# Patient Record
Sex: Female | Born: 1955 | Race: White | Hispanic: No | Smoking: Current every day smoker
Health system: Southern US, Community
[De-identification: ages and names within clinical notes are randomized; demographics above are authoritative.]

## PROBLEM LIST (undated history)

## (undated) DIAGNOSIS — F329 Major depressive disorder, single episode, unspecified: Secondary | ICD-10-CM

## (undated) DIAGNOSIS — J449 Chronic obstructive pulmonary disease, unspecified: Secondary | ICD-10-CM

## (undated) DIAGNOSIS — K219 Gastro-esophageal reflux disease without esophagitis: Secondary | ICD-10-CM

## (undated) DIAGNOSIS — F1910 Other psychoactive substance abuse, uncomplicated: Secondary | ICD-10-CM

## (undated) DIAGNOSIS — F32A Depression, unspecified: Secondary | ICD-10-CM

## (undated) DIAGNOSIS — M1990 Unspecified osteoarthritis, unspecified site: Secondary | ICD-10-CM

## (undated) DIAGNOSIS — Z8719 Personal history of other diseases of the digestive system: Secondary | ICD-10-CM

## (undated) HISTORY — PX: TUBAL LIGATION: SHX77

## (undated) HISTORY — DX: Major depressive disorder, single episode, unspecified: F32.9

## (undated) HISTORY — PX: BUNIONECTOMY: SHX129

## (undated) HISTORY — DX: Depression, unspecified: F32.A

## (undated) HISTORY — DX: Other psychoactive substance abuse, uncomplicated: F19.10

## (undated) HISTORY — PX: KNEE SURGERY: SHX244

## (undated) HISTORY — PX: JOINT REPLACEMENT: SHX530

---

## 1994-09-24 HISTORY — PX: BUNIONECTOMY: SHX129

## 1998-01-11 ENCOUNTER — Ambulatory Visit (HOSPITAL_BASED_OUTPATIENT_CLINIC_OR_DEPARTMENT_OTHER): Admission: RE | Admit: 1998-01-11 | Discharge: 1998-01-11 | Payer: Self-pay | Admitting: Orthopedic Surgery

## 1998-09-24 HISTORY — PX: TUBAL LIGATION: SHX77

## 2000-11-15 ENCOUNTER — Ambulatory Visit (HOSPITAL_COMMUNITY): Admission: RE | Admit: 2000-11-15 | Discharge: 2000-11-15 | Payer: Self-pay | Admitting: Orthopedic Surgery

## 2000-11-15 ENCOUNTER — Encounter: Payer: Self-pay | Admitting: Orthopedic Surgery

## 2000-11-29 ENCOUNTER — Encounter: Payer: Self-pay | Admitting: Orthopedic Surgery

## 2000-11-29 ENCOUNTER — Ambulatory Visit (HOSPITAL_COMMUNITY): Admission: RE | Admit: 2000-11-29 | Discharge: 2000-11-29 | Payer: Self-pay | Admitting: Orthopedic Surgery

## 2000-12-13 ENCOUNTER — Encounter: Payer: Self-pay | Admitting: Orthopedic Surgery

## 2000-12-13 ENCOUNTER — Ambulatory Visit (HOSPITAL_COMMUNITY): Admission: RE | Admit: 2000-12-13 | Discharge: 2000-12-13 | Payer: Self-pay | Admitting: Orthopedic Surgery

## 2001-10-08 ENCOUNTER — Ambulatory Visit (HOSPITAL_COMMUNITY): Admission: RE | Admit: 2001-10-08 | Discharge: 2001-10-08 | Payer: Self-pay | Admitting: Family Medicine

## 2001-10-08 ENCOUNTER — Encounter: Payer: Self-pay | Admitting: Family Medicine

## 2002-01-26 ENCOUNTER — Ambulatory Visit (HOSPITAL_COMMUNITY): Admission: RE | Admit: 2002-01-26 | Discharge: 2002-01-26 | Payer: Self-pay | Admitting: Gastroenterology

## 2002-01-26 ENCOUNTER — Encounter: Payer: Self-pay | Admitting: Gastroenterology

## 2002-06-01 ENCOUNTER — Encounter: Admission: RE | Admit: 2002-06-01 | Discharge: 2002-06-01 | Payer: Self-pay | Admitting: Gastroenterology

## 2002-06-01 ENCOUNTER — Encounter: Payer: Self-pay | Admitting: Gastroenterology

## 2004-11-01 ENCOUNTER — Ambulatory Visit (HOSPITAL_COMMUNITY): Admission: RE | Admit: 2004-11-01 | Discharge: 2004-11-01 | Payer: Self-pay | Admitting: Family Medicine

## 2007-12-02 ENCOUNTER — Other Ambulatory Visit: Admission: RE | Admit: 2007-12-02 | Discharge: 2007-12-02 | Payer: Self-pay | Admitting: Family Medicine

## 2009-03-01 ENCOUNTER — Other Ambulatory Visit: Admission: RE | Admit: 2009-03-01 | Discharge: 2009-03-01 | Payer: Self-pay | Admitting: Family Medicine

## 2009-03-01 ENCOUNTER — Encounter: Admission: RE | Admit: 2009-03-01 | Discharge: 2009-03-01 | Payer: Self-pay | Admitting: Family Medicine

## 2011-01-16 ENCOUNTER — Other Ambulatory Visit: Payer: Self-pay | Admitting: Neurosurgery

## 2011-01-16 DIAGNOSIS — M549 Dorsalgia, unspecified: Secondary | ICD-10-CM

## 2011-01-19 ENCOUNTER — Other Ambulatory Visit: Payer: Self-pay | Admitting: Neurosurgery

## 2011-01-19 ENCOUNTER — Ambulatory Visit
Admission: RE | Admit: 2011-01-19 | Discharge: 2011-01-19 | Disposition: A | Discharge status: Home / Self Care | Payer: BC Managed Care – PPO | Source: Ambulatory Visit | Attending: Neurosurgery | Admitting: Neurosurgery

## 2011-01-19 DIAGNOSIS — M549 Dorsalgia, unspecified: Secondary | ICD-10-CM

## 2011-02-02 ENCOUNTER — Ambulatory Visit
Admission: RE | Admit: 2011-02-02 | Discharge: 2011-02-02 | Disposition: A | Discharge status: Home / Self Care | Payer: BC Managed Care – PPO | Source: Ambulatory Visit | Attending: Neurosurgery | Admitting: Neurosurgery

## 2011-02-02 DIAGNOSIS — M549 Dorsalgia, unspecified: Secondary | ICD-10-CM

## 2011-09-19 ENCOUNTER — Other Ambulatory Visit: Payer: Self-pay | Admitting: Neurosurgery

## 2011-09-19 DIAGNOSIS — M549 Dorsalgia, unspecified: Secondary | ICD-10-CM

## 2011-09-25 HISTORY — PX: PILONIDAL CYST EXCISION: SHX744

## 2011-09-26 ENCOUNTER — Ambulatory Visit
Admission: RE | Admit: 2011-09-26 | Discharge: 2011-09-26 | Disposition: A | Discharge status: Home / Self Care | Payer: 59 | Source: Ambulatory Visit | Attending: Neurosurgery | Admitting: Neurosurgery

## 2011-09-26 DIAGNOSIS — M549 Dorsalgia, unspecified: Secondary | ICD-10-CM

## 2011-09-26 MED ORDER — METHYLPREDNISOLONE ACETATE 40 MG/ML INJ SUSP (RADIOLOG
120.0000 mg | Freq: Once | INTRAMUSCULAR | Status: AC
  Administered 2011-09-26: 120 mg via EPIDURAL

## 2011-09-26 MED ORDER — IOHEXOL 180 MG/ML  SOLN
1.0000 mL | Freq: Once | INTRAMUSCULAR | Status: AC | PRN
  Administered 2011-09-26: 1 mL via EPIDURAL

## 2012-08-05 ENCOUNTER — Other Ambulatory Visit: Payer: Self-pay | Admitting: Family Medicine

## 2012-08-05 ENCOUNTER — Other Ambulatory Visit (HOSPITAL_COMMUNITY)
Admission: RE | Admit: 2012-08-05 | Discharge: 2012-08-05 | Disposition: A | Discharge status: Home / Self Care | Payer: 59 | Source: Ambulatory Visit | Attending: Family Medicine | Admitting: Family Medicine

## 2012-08-05 DIAGNOSIS — Z0000 Encounter for general adult medical examination without abnormal findings: Secondary | ICD-10-CM | POA: Insufficient documentation

## 2012-08-05 DIAGNOSIS — Z113 Encounter for screening for infections with a predominantly sexual mode of transmission: Secondary | ICD-10-CM | POA: Insufficient documentation

## 2012-08-06 ENCOUNTER — Other Ambulatory Visit: Payer: Self-pay | Admitting: Family Medicine

## 2012-08-06 DIAGNOSIS — J3489 Other specified disorders of nose and nasal sinuses: Secondary | ICD-10-CM

## 2012-08-07 ENCOUNTER — Other Ambulatory Visit: Payer: 59

## 2012-08-08 ENCOUNTER — Ambulatory Visit
Admission: RE | Admit: 2012-08-08 | Discharge: 2012-08-08 | Disposition: A | Discharge status: Home / Self Care | Payer: 59 | Source: Ambulatory Visit | Attending: Family Medicine | Admitting: Family Medicine

## 2012-08-08 ENCOUNTER — Other Ambulatory Visit: Payer: 59

## 2012-08-08 DIAGNOSIS — J3489 Other specified disorders of nose and nasal sinuses: Secondary | ICD-10-CM

## 2012-10-06 ENCOUNTER — Ambulatory Visit: Payer: 59 | Admitting: Family Medicine

## 2012-10-06 VITALS — BP 128/76 | HR 83 | Temp 98.7°F | Resp 17 | Ht 65.5 in | Wt 135.0 lb

## 2012-10-06 DIAGNOSIS — Z111 Encounter for screening for respiratory tuberculosis: Secondary | ICD-10-CM

## 2012-10-06 NOTE — Progress Notes (Signed)
[facility] [address], [place] [address] [phone] [phone]  Date:  10/06/2012   Name:  DANNIE WOOLEN   DOB:  06/20/1956   MRN:  [id number]  PCP:  No primary provider on file.    Chief Complaint: Immunizations   History of Present Illness:  Gabrielle Garza is a 57 y.o. very pleasant female patient who presents with the following:  She needs the 2nd part of her 2 step PPD.  She had another test performed at [facility]- it was placed on 09/26/12 and read on 09/29/12.    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   Past Medical History  Diagnosis Date  . Depression   . Substance abuse     Past Surgical History  Procedure Date  . Tubal ligation   . Joint replacement   . Bunionectomy     History  Substance Use Topics  . Smoking status: Current Every Day Smoker -- 0.5 packs/day for 30 years    Types: Cigarettes  . Smokeless tobacco: Not on file  . Alcohol Use: No    History reviewed. No pertinent family history.  Allergies  Allergen Reactions  . Flonase (Fluticasone Propionate) Other (See Comments)    Headache     Medication list has been reviewed and updated.  Current Outpatient Prescriptions on File Prior to Visit  Medication Sig Dispense Refill  . atorvastatin (LIPITOR) 20 MG tablet Take 20 mg by mouth daily.      . sertraline (ZOLOFT) 100 MG tablet Take 100 mg by mouth daily.        Review of Systems:  As per HPI- otherwise negative.   Physical Examination: Filed Vitals:   10/06/12 1555  BP: 128/76  Pulse: 83  Temp: 98.7 F (37.1 C)  Resp: 17   Filed Vitals:   10/06/12 1555  Height: 5' 5.5" (1.664 m)  Weight: 135 lb (61.236 kg)   Body mass index is 22.12 kg/(m^2). Ideal Body Weight: Weight in (lb) to have BMI = 25: 152.2   GEN: WDWN, NAD, Non-toxic, A & O x 3 HEENT: Atraumatic, Normocephalic. Neck supple. No  masses, No LAD. Ears and Nose: No external deformity. CV: RRR, No M/G/R. No JVD. No thrill. No extra heart sounds. PULM: CTA B, no wheezes, crackles, rhonchi. No retractions. No resp. distress. No accessory muscle use. EXTR: No c/c/e NEURO Normal gait.  PSYCH: Normally interactive. Conversant. Not depressed or anxious appearing.  Calm demeanor.    Assessment and Plan: 1. Screening-pulmonary TB  TB Skin Test   Placed 2nd PPD for 2 step PPD- follow- up for read as scheduled  Heather Mckendree, MD

## 2012-10-08 ENCOUNTER — Ambulatory Visit (INDEPENDENT_AMBULATORY_CARE_PROVIDER_SITE_OTHER): Payer: 59 | Admitting: Physician Assistant

## 2012-10-08 DIAGNOSIS — Z111 Encounter for screening for respiratory tuberculosis: Secondary | ICD-10-CM

## 2012-10-08 LAB — TB SKIN TEST: TB Skin Test: NEGATIVE

## 2012-10-08 NOTE — Progress Notes (Signed)
Patient here for PPD reading only. Please reading done by CMA. No provider/patient encounter occurred today.   Eula Listen, PA-C 10/08/2012 8:10 PM

## 2013-03-03 ENCOUNTER — Ambulatory Visit
Admission: RE | Admit: 2013-03-03 | Discharge: 2013-03-03 | Disposition: A | Discharge status: Home / Self Care | Payer: 59 | Source: Ambulatory Visit | Attending: Family Medicine | Admitting: Family Medicine

## 2013-03-03 ENCOUNTER — Other Ambulatory Visit: Payer: Self-pay | Admitting: Family Medicine

## 2015-08-25 ENCOUNTER — Other Ambulatory Visit: Payer: Self-pay | Admitting: Family Medicine

## 2015-08-25 DIAGNOSIS — M5136 Other intervertebral disc degeneration, lumbar region: Secondary | ICD-10-CM

## 2015-08-30 ENCOUNTER — Ambulatory Visit
Admission: RE | Admit: 2015-08-30 | Discharge: 2015-08-30 | Disposition: A | Discharge status: Home / Self Care | Payer: Managed Care, Other (non HMO) | Source: Ambulatory Visit | Attending: Family Medicine | Admitting: Family Medicine

## 2015-08-30 DIAGNOSIS — M5136 Other intervertebral disc degeneration, lumbar region: Secondary | ICD-10-CM

## 2015-09-02 ENCOUNTER — Other Ambulatory Visit: Payer: Self-pay | Admitting: Obstetrics and Gynecology

## 2015-09-02 ENCOUNTER — Other Ambulatory Visit (HOSPITAL_COMMUNITY)
Admission: RE | Admit: 2015-09-02 | Discharge: 2015-09-02 | Disposition: A | Discharge status: Home / Self Care | Payer: Managed Care, Other (non HMO) | Source: Ambulatory Visit | Attending: Obstetrics and Gynecology | Admitting: Obstetrics and Gynecology

## 2015-09-02 DIAGNOSIS — Z113 Encounter for screening for infections with a predominantly sexual mode of transmission: Secondary | ICD-10-CM | POA: Diagnosis present

## 2015-09-02 DIAGNOSIS — Z1151 Encounter for screening for human papillomavirus (HPV): Secondary | ICD-10-CM | POA: Diagnosis present

## 2015-09-02 DIAGNOSIS — Z01419 Encounter for gynecological examination (general) (routine) without abnormal findings: Secondary | ICD-10-CM | POA: Insufficient documentation

## 2015-09-06 LAB — CYTOLOGY - PAP

## 2018-06-12 ENCOUNTER — Other Ambulatory Visit: Payer: Self-pay | Admitting: Family Medicine

## 2018-06-12 ENCOUNTER — Other Ambulatory Visit: Payer: Managed Care, Other (non HMO)

## 2018-06-12 ENCOUNTER — Ambulatory Visit
Admission: RE | Admit: 2018-06-12 | Discharge: 2018-06-12 | Disposition: A | Discharge status: Home / Self Care | Payer: Managed Care, Other (non HMO) | Source: Ambulatory Visit | Attending: Family Medicine | Admitting: Family Medicine

## 2018-06-12 DIAGNOSIS — M25551 Pain in right hip: Secondary | ICD-10-CM

## 2018-10-01 ENCOUNTER — Other Ambulatory Visit: Payer: Self-pay | Admitting: Acute Care

## 2018-10-01 DIAGNOSIS — Z122 Encounter for screening for malignant neoplasm of respiratory organs: Secondary | ICD-10-CM

## 2018-10-01 DIAGNOSIS — F17210 Nicotine dependence, cigarettes, uncomplicated: Principal | ICD-10-CM

## 2018-10-15 ENCOUNTER — Ambulatory Visit (INDEPENDENT_AMBULATORY_CARE_PROVIDER_SITE_OTHER): Payer: 59 | Admitting: Acute Care

## 2018-10-15 ENCOUNTER — Encounter: Payer: Self-pay | Admitting: Acute Care

## 2018-10-15 ENCOUNTER — Ambulatory Visit (INDEPENDENT_AMBULATORY_CARE_PROVIDER_SITE_OTHER)
Admission: RE | Admit: 2018-10-15 | Discharge: 2018-10-15 | Disposition: A | Discharge status: Home / Self Care | Payer: 59 | Source: Ambulatory Visit | Attending: Acute Care | Admitting: Acute Care

## 2018-10-15 VITALS — BP 98/68 | HR 78 | Ht 65.5 in | Wt 136.6 lb

## 2018-10-15 DIAGNOSIS — F17210 Nicotine dependence, cigarettes, uncomplicated: Secondary | ICD-10-CM

## 2018-10-15 DIAGNOSIS — Z122 Encounter for screening for malignant neoplasm of respiratory organs: Secondary | ICD-10-CM

## 2018-10-15 NOTE — Progress Notes (Signed)
Shared Decision Making Visit Lung Cancer Screening Program [phone])  Referred by Dr. Laurann Montana  Eligibility:  Age 63 y.o.  Pack Years Smoking History Calculation 34 pack year smoking history (# packs/per year x # years smoked)  Recent History of coughing up blood  no  Unexplained weight loss? no ( >Than 15 pounds within the last 6 months )  Prior History Lung / other cancer no (Diagnosis within the last 5 years already requiring surveillance chest CT Scans).  Smoking Status Current Smoker  Former Smokers: Years since quit: NA  Quit Date: NA  Visit Components:  Discussion included one or more decision making aids. yes  Discussion included risk/benefits of screening. yes  Discussion included potential follow up diagnostic testing for abnormal scans.Yes  Discussion included meaning and risk of over diagnosis. yes  Discussion included meaning and risk of False Positives. yes  Discussion included meaning of total radiation exposure. yes  Counseling Included:  Importance of adherence to annual lung cancer LDCT screening. yes  Impact of comorbidities on ability to participate in the program. yes  Ability and willingness to under diagnostic treatment. yes  Smoking Cessation Counseling:  Current Smokers:   Discussed importance of smoking cessation. yes  Information about tobacco cessation classes and interventions provided to patient. yes  Patient provided with "ticket" for LDCT Scan. yes  Symptomatic Patient. no  Counseling  Diagnosis Code: Tobacco Use Z72.0  Asymptomatic Patient yes  Counseling (Intermediate counseling: > three minutes counseling) C5885  Former Smokers:   Discussed the importance of maintaining cigarette abstinence. yes  Diagnosis Code: Personal History of Nicotine Dependence. O27.741  Information about tobacco cessation classes and interventions provided to patient. Yes  Patient provided with "ticket" for LDCT Scan. yes  Written  Order for Lung Cancer Screening with LDCT placed in Epic. Yes (CT Chest Lung Cancer Screening Low Dose W/O CM) [id number] Z12.2-Screening of respiratory organs Z87.891-Personal history of nicotine dependence  I have spent 25 minutes of face to face time with Ms. Halsted discussing the risks and benefits of lung cancer screening. We viewed a power point together that explained in detail the above noted topics. We paused at intervals to allow for questions to be asked and answered to ensure understanding.We discussed that the single most powerful action that she can take to decrease her risk of developing lung cancer is to quit smoking. We discussed whether or not she is ready to commit to setting a quit date. We discussed options for tools to aid in quitting smoking including nicotine replacement therapy, non-nicotine medications, support groups, Quit Smart classes, and behavior modification. We discussed that often times setting smaller, more achievable goals, such as eliminating 1 cigarette a day for a week and then 2 cigarettes a day for a week can be helpful in slowly decreasing the number of cigarettes smoked. This allows for a sense of accomplishment as well as providing a clinical benefit. I gave her the " Be Stronger Than Your Excuses" card with contact information for community resources, classes, free nicotine replacement therapy, and access to mobile apps, text messaging, and on-line smoking cessation help. I have also given her my card and contact information in the event she needs to contact me. We discussed the time and location of the scan, and that either Abigail Miyamoto RN or I will call with the results within 24-48 hours of receiving them. I have offered her  a copy of the power point we viewed  as a resource in the event  they need reinforcement of the concepts we discussed today in the office. The patient verbalized understanding of all of  the above and had no further questions upon leaving the  office. They have my contact information in the event they have any further questions.  I spent 3 minutes counseling on smoking cessation and the health risks of continued tobacco abuse.  I explained to the patient that there has been a high incidence of coronary artery disease noted on these exams. I explained that this is a non-gated exam therefore degree or severity cannot be determined. This patient is currently on statin therapy. I have asked the patient to follow-up with their PCP regarding any incidental finding of coronary artery disease and management with diet or medication as their PCP  feels is clinically indicated. The patient verbalized understanding of the above and had no further questions upon completion of the visit.      Bevelyn NgoSarah F Pharaoh Pio, NP 10/15/2018 9:52 AM

## 2018-10-16 ENCOUNTER — Other Ambulatory Visit: Payer: Self-pay | Admitting: Acute Care

## 2018-10-16 DIAGNOSIS — F17210 Nicotine dependence, cigarettes, uncomplicated: Principal | ICD-10-CM

## 2018-10-16 DIAGNOSIS — Z122 Encounter for screening for malignant neoplasm of respiratory organs: Secondary | ICD-10-CM

## 2018-10-29 ENCOUNTER — Other Ambulatory Visit: Payer: Self-pay | Admitting: Family Medicine

## 2018-10-29 DIAGNOSIS — M8580 Other specified disorders of bone density and structure, unspecified site: Secondary | ICD-10-CM

## 2018-10-29 DIAGNOSIS — Z1231 Encounter for screening mammogram for malignant neoplasm of breast: Secondary | ICD-10-CM

## 2018-12-30 ENCOUNTER — Ambulatory Visit: Payer: 59

## 2018-12-30 ENCOUNTER — Other Ambulatory Visit: Payer: 59

## 2019-10-30 ENCOUNTER — Ambulatory Visit (INDEPENDENT_AMBULATORY_CARE_PROVIDER_SITE_OTHER)
Admission: RE | Admit: 2019-10-30 | Discharge: 2019-10-30 | Disposition: A | Discharge status: Home / Self Care | Payer: 59 | Source: Ambulatory Visit | Attending: Acute Care | Admitting: Acute Care

## 2019-10-30 ENCOUNTER — Other Ambulatory Visit: Payer: Self-pay

## 2019-10-30 DIAGNOSIS — Z122 Encounter for screening for malignant neoplasm of respiratory organs: Secondary | ICD-10-CM

## 2019-10-30 DIAGNOSIS — Z87891 Personal history of nicotine dependence: Secondary | ICD-10-CM | POA: Diagnosis not present

## 2019-10-30 DIAGNOSIS — F17210 Nicotine dependence, cigarettes, uncomplicated: Secondary | ICD-10-CM

## 2019-11-05 ENCOUNTER — Other Ambulatory Visit: Payer: Self-pay | Admitting: *Deleted

## 2019-11-05 DIAGNOSIS — F17210 Nicotine dependence, cigarettes, uncomplicated: Secondary | ICD-10-CM

## 2019-11-05 NOTE — Progress Notes (Signed)
Please call patient and let them  know their  low dose Ct was read as a Lung RADS 2: nodules that are benign in appearance and behavior with a very low likelihood of becoming a clinically active cancer due to size or lack of growth. Recommendation per radiology is for a repeat LDCT in 12 months. Please let them  know we will order and schedule their  annual screening scan for 10/2020. Please let them  know there was notation of CAD on their  scan.  Please remind the patient  that this is a non-gated exam therefore degree or severity of disease  cannot be determined. Please have them  follow up with their PCP regarding potential risk factor modification, dietary therapy or pharmacologic therapy if clinically indicated. Pt.  is  currently on statin therapy. Please place order for annual  screening scan for  10/2020 and fax results to PCP. Thanks so much. 

## 2019-12-30 ENCOUNTER — Other Ambulatory Visit: Payer: Self-pay

## 2019-12-30 ENCOUNTER — Other Ambulatory Visit: Payer: Self-pay | Admitting: Podiatrist

## 2019-12-30 ENCOUNTER — Ambulatory Visit (INDEPENDENT_AMBULATORY_CARE_PROVIDER_SITE_OTHER): Payer: 59 | Admitting: Podiatrist

## 2019-12-30 ENCOUNTER — Encounter: Payer: Self-pay | Admitting: Podiatrist

## 2019-12-30 ENCOUNTER — Ambulatory Visit (INDEPENDENT_AMBULATORY_CARE_PROVIDER_SITE_OTHER): Payer: 59

## 2019-12-30 VITALS — BP 112/71 | HR 64 | Temp 97.9°F | Resp 16

## 2019-12-30 DIAGNOSIS — L84 Corns and callosities: Secondary | ICD-10-CM | POA: Diagnosis not present

## 2019-12-30 DIAGNOSIS — S96912A Strain of unspecified muscle and tendon at ankle and foot level, left foot, initial encounter: Secondary | ICD-10-CM

## 2019-12-30 DIAGNOSIS — M79672 Pain in left foot: Secondary | ICD-10-CM | POA: Diagnosis not present

## 2019-12-30 DIAGNOSIS — M25572 Pain in left ankle and joints of left foot: Secondary | ICD-10-CM

## 2020-01-03 ENCOUNTER — Encounter: Payer: Self-pay | Admitting: Podiatrist

## 2020-01-03 NOTE — Progress Notes (Signed)
  Chief Complaint  Patient presents with  . Foot Pain    Left foot; Dorsal; pt stated, "Pain radiates above ankle; hurts to walk on; pain is on and off; feels like if I step down on something, my foot will break"; x1+ yrs  . Callouses    Bilateral; plantar forefoot-submet 1 (towards medial side); pt stated, "Had bunionectomy surgery approx in 1996 on both feet; after that is when I got callouses"     HPI: Patient is 64 y.o. female who presents today for  Pain on the top of the left foot for over a year as well as calluses that are painful bilateral first metatarsal region.    Review of Systems  DATA OBTAINED: from patient  GENERAL: Feels well no fevers, no fatigue, no changes in appetite SKIN: No itching, no rashes, no open wounds EYES: No eye pain,no redness, no discharge EARS: No earache,no ringing of ears, NOSE: No congestion, no drainage, no bleeding  MOUTH/THROAT: No mouth pain, No sore throat, No difficulty chewing or swallowing  RESPIRATORY: No cough, no wheezing, no SOB CARDIAC: No chest pain,no heart palpitations, GI: No abdominal pain, No Nausea, no vomiting, no diarrhea, no heartburn or no reflux  GU: No dysuria, no increased frequency or urgency MUSCULOSKELETAL: No unrelieved bone/joint pain,  NEUROLOGIC: Awake, alert, appropriate to situation, No change in mental status. PSYCHIATRIC: No overt anxiety or sadness.No behavior issue.      Physical Exam  GENERAL APPEARANCE: Alert, conversant. Appropriately groomed. No acute distress.   VASCULAR: Pedal pulses palpable DP and PT bilateral.  Capillary refill time is immediate to all digits,  Proximal to distal cooling it warm to warm.  Digital hair growth is present bilateral   NEUROLOGIC: sensation is intact epicritically and protectively to 5.07 monofilament at 5/5 sites bilateral.  Light touch is intact bilateral, vibratory sensation intact bilateral, achilles tendon reflex is intact bilateral.   MUSCULOSKELETAL:  acceptable muscle strength, tone and stability bilateral.  Intrinsic muscluature intact bilateral.  Flexible flat foot noted left with pain I the sinus tarsi region of the left.  pionpoint tenderness elicited and a pinch effect occurs when standing.  Prominent metatarsal heads bilateral first noted.   DERMATOLOGIC: skin is warm, supple, and dry.  No open lesions noted.  No interdigital maceration noted bilateral. Callus present bilateral first metatarsal  Digital nails are asymptomatifc.    xrays are negative for fracture.  Bunion deformity noted  Assessment     ICD-10-CM   1. Left foot pain  M79.672 CANCELED: DG Foot Complete Left  2. Sinus tarsi syndrome of left ankle  M25.572   3. Callus of foot  L84      Plan  Sinus tarsi injection recommended and carried out today with kenalog and marcaine plain.  Recommended supportive shoes to keep her feet from flattening and she is a candidate for a controlling orthotic device.  Also pared her calluses today without complication.  She will return for follow up if the sinus tarsi injection fails to relieve her pain.

## 2020-02-02 ENCOUNTER — Other Ambulatory Visit: Payer: Self-pay | Admitting: Family Medicine

## 2020-02-02 DIAGNOSIS — R1031 Right lower quadrant pain: Secondary | ICD-10-CM

## 2020-02-12 ENCOUNTER — Other Ambulatory Visit: Payer: Self-pay | Admitting: Family Medicine

## 2020-02-16 ENCOUNTER — Other Ambulatory Visit: Payer: 59

## 2020-03-15 ENCOUNTER — Other Ambulatory Visit: Payer: Self-pay | Admitting: Family Medicine

## 2020-03-15 DIAGNOSIS — R1031 Right lower quadrant pain: Secondary | ICD-10-CM

## 2020-03-17 ENCOUNTER — Ambulatory Visit
Admission: RE | Admit: 2020-03-17 | Discharge: 2020-03-17 | Disposition: A | Discharge status: Home / Self Care | Payer: 59 | Source: Ambulatory Visit | Attending: Family Medicine | Admitting: Family Medicine

## 2020-03-17 DIAGNOSIS — R1031 Right lower quadrant pain: Secondary | ICD-10-CM

## 2020-03-25 ENCOUNTER — Other Ambulatory Visit: Payer: Self-pay | Admitting: Family Medicine

## 2020-03-25 DIAGNOSIS — R1031 Right lower quadrant pain: Secondary | ICD-10-CM

## 2020-03-26 ENCOUNTER — Ambulatory Visit: Payer: 59 | Attending: Internal Medicine

## 2020-03-26 DIAGNOSIS — Z23 Encounter for immunization: Secondary | ICD-10-CM

## 2020-03-26 NOTE — Progress Notes (Signed)
   Covid-19 Vaccination Clinic  Name:  Gabrielle Garza    MRN: [id number] DOB: Nov 06, 1955  03/26/2020  Gabrielle Garza was observed post Covid-19 immunization for 15 minutes without incident. She was provided with Vaccine Information Sheet and instruction to access the V-Safe system.   Gabrielle Garza was instructed to call 911 with any severe reactions post vaccine: Marland Kitchen Difficulty breathing  . Swelling of face and throat  . A fast heartbeat  . A bad rash all over body  . Dizziness and weakness   Immunizations Administered    Name Date Dose VIS Date Route   Moderna COVID-19 Vaccine 03/26/2020 11:12 AM 0.5 mL 08/2019 Intramuscular   Manufacturer: Moderna   Lot: [id number]   NDC: 16967-893-81

## 2020-04-12 ENCOUNTER — Other Ambulatory Visit: Payer: Self-pay

## 2020-04-12 ENCOUNTER — Ambulatory Visit
Admission: RE | Admit: 2020-04-12 | Discharge: 2020-04-12 | Disposition: A | Discharge status: Home / Self Care | Payer: Managed Care, Other (non HMO) | Source: Ambulatory Visit | Attending: Family Medicine | Admitting: Family Medicine

## 2020-04-12 DIAGNOSIS — R1031 Right lower quadrant pain: Secondary | ICD-10-CM

## 2020-04-12 MED ORDER — IOPAMIDOL (ISOVUE-300) INJECTION 61%
100.0000 mL | Freq: Once | INTRAVENOUS | Status: AC | PRN
  Administered 2020-04-12: 100 mL via INTRAVENOUS

## 2020-04-23 ENCOUNTER — Ambulatory Visit: Payer: Managed Care, Other (non HMO) | Attending: Internal Medicine

## 2020-04-23 DIAGNOSIS — Z23 Encounter for immunization: Secondary | ICD-10-CM

## 2020-04-23 NOTE — Progress Notes (Signed)
   Covid-19 Vaccination Clinic  Name:  GRIER CZERWINSKI    MRN: [id number] DOB: Jul 29, 1956  04/23/2020  Ms. Licht was observed post Covid-19 immunization for 15 minutes without incident. She was provided with Vaccine Information Sheet and instruction to access the V-Safe system.   Ms. Sens was instructed to call 911 with any severe reactions post vaccine: Marland Kitchen Difficulty breathing  . Swelling of face and throat  . A fast heartbeat  . A bad rash all over body  . Dizziness and weakness   Immunizations Administered    Name Date Dose VIS Date Route   Moderna COVID-19 Vaccine 04/23/2020 11:40 AM 0.5 mL 08/2019 Intramuscular   Manufacturer: Moderna   Lot: [id number]   NDC: 35248-185-90

## 2020-04-27 ENCOUNTER — Other Ambulatory Visit: Payer: Self-pay | Admitting: Family Medicine

## 2020-04-27 DIAGNOSIS — M25551 Pain in right hip: Secondary | ICD-10-CM

## 2020-12-13 DIAGNOSIS — M1990 Unspecified osteoarthritis, unspecified site: Secondary | ICD-10-CM | POA: Diagnosis not present

## 2020-12-13 DIAGNOSIS — M25551 Pain in right hip: Secondary | ICD-10-CM | POA: Diagnosis not present

## 2020-12-13 DIAGNOSIS — M79643 Pain in unspecified hand: Secondary | ICD-10-CM | POA: Diagnosis not present

## 2020-12-13 DIAGNOSIS — M0579 Rheumatoid arthritis with rheumatoid factor of multiple sites without organ or systems involvement: Secondary | ICD-10-CM | POA: Diagnosis not present

## 2020-12-13 DIAGNOSIS — Z79899 Other long term (current) drug therapy: Secondary | ICD-10-CM | POA: Diagnosis not present

## 2020-12-13 DIAGNOSIS — R768 Other specified abnormal immunological findings in serum: Secondary | ICD-10-CM | POA: Diagnosis not present

## 2021-01-25 ENCOUNTER — Other Ambulatory Visit (HOSPITAL_COMMUNITY)
Admission: RE | Admit: 2021-01-25 | Discharge: 2021-01-25 | Disposition: A | Discharge status: Home / Self Care | Payer: 59 | Source: Ambulatory Visit | Attending: Family Medicine | Admitting: Family Medicine

## 2021-01-25 ENCOUNTER — Other Ambulatory Visit: Payer: Self-pay | Admitting: Family Medicine

## 2021-01-25 DIAGNOSIS — Z124 Encounter for screening for malignant neoplasm of cervix: Secondary | ICD-10-CM | POA: Diagnosis not present

## 2021-01-25 DIAGNOSIS — E559 Vitamin D deficiency, unspecified: Secondary | ICD-10-CM | POA: Diagnosis not present

## 2021-01-25 DIAGNOSIS — R413 Other amnesia: Secondary | ICD-10-CM | POA: Diagnosis not present

## 2021-01-25 DIAGNOSIS — Z1211 Encounter for screening for malignant neoplasm of colon: Secondary | ICD-10-CM | POA: Diagnosis not present

## 2021-01-25 DIAGNOSIS — R69 Illness, unspecified: Secondary | ICD-10-CM | POA: Diagnosis not present

## 2021-01-25 DIAGNOSIS — M069 Rheumatoid arthritis, unspecified: Secondary | ICD-10-CM | POA: Diagnosis not present

## 2021-01-25 DIAGNOSIS — M8588 Other specified disorders of bone density and structure, other site: Secondary | ICD-10-CM | POA: Diagnosis not present

## 2021-01-25 DIAGNOSIS — Z0000 Encounter for general adult medical examination without abnormal findings: Secondary | ICD-10-CM | POA: Diagnosis not present

## 2021-01-25 DIAGNOSIS — E785 Hyperlipidemia, unspecified: Secondary | ICD-10-CM | POA: Diagnosis not present

## 2021-01-30 ENCOUNTER — Other Ambulatory Visit: Payer: Self-pay | Admitting: Family Medicine

## 2021-01-30 DIAGNOSIS — F17200 Nicotine dependence, unspecified, uncomplicated: Secondary | ICD-10-CM

## 2021-01-30 LAB — CYTOLOGY - PAP
Comment: NEGATIVE
High risk HPV: NEGATIVE

## 2021-02-06 ENCOUNTER — Telehealth: Payer: Self-pay | Admitting: Acute Care

## 2021-02-06 NOTE — Telephone Encounter (Signed)
Gabrielle Garza has her LCS CT scheduled on 02/10/21 ordered by Gabrielle Garza. She was calling in to make sure she was getting the correct CT done.   I called [facility] asking them to make sure Gabrielle Garza gets a copy of the results.  Patient was due 10/2020 and I had LVM on 11/11/20

## 2021-02-09 DIAGNOSIS — M810 Age-related osteoporosis without current pathological fracture: Secondary | ICD-10-CM | POA: Diagnosis not present

## 2021-02-09 DIAGNOSIS — Z1231 Encounter for screening mammogram for malignant neoplasm of breast: Secondary | ICD-10-CM | POA: Diagnosis not present

## 2021-02-09 DIAGNOSIS — M8589 Other specified disorders of bone density and structure, multiple sites: Secondary | ICD-10-CM | POA: Diagnosis not present

## 2021-02-09 DIAGNOSIS — Z780 Asymptomatic menopausal state: Secondary | ICD-10-CM | POA: Diagnosis not present

## 2021-02-10 ENCOUNTER — Ambulatory Visit
Admission: RE | Admit: 2021-02-10 | Discharge: 2021-02-10 | Disposition: A | Discharge status: Home / Self Care | Payer: 59 | Source: Ambulatory Visit | Attending: Family Medicine | Admitting: Family Medicine

## 2021-02-10 DIAGNOSIS — R69 Illness, unspecified: Secondary | ICD-10-CM | POA: Diagnosis not present

## 2021-02-10 DIAGNOSIS — F17200 Nicotine dependence, unspecified, uncomplicated: Secondary | ICD-10-CM

## 2021-02-23 ENCOUNTER — Encounter: Payer: Self-pay | Admitting: *Deleted

## 2021-02-23 DIAGNOSIS — F17210 Nicotine dependence, cigarettes, uncomplicated: Secondary | ICD-10-CM

## 2021-02-23 NOTE — Progress Notes (Signed)
Please call patient and let them  know their  low dose Ct was read as a Lung RADS 2: nodules that are benign in appearance and behavior with a very low likelihood of becoming a clinically active cancer due to size or lack of growth. Recommendation per radiology is for a repeat LDCT in 12 months. .Please let them  know we will order and schedule their  annual screening scan for 01/2022. Please let them  know there was notation of CAD on their  scan.  Please remind the patient  that this is a non-gated exam therefore degree or severity of disease  cannot be determined. Please have them  follow up with their PCP regarding potential risk factor modification, dietary therapy or pharmacologic therapy if clinically indicated. Pt.  is  currently on statin therapy. Please place order for annual  screening scan for  01/2022 and fax results to PCP. Thanks so much.  Please Have patient follow up with PCP about the CAD finding. Thanks so much

## 2021-03-13 DIAGNOSIS — M1990 Unspecified osteoarthritis, unspecified site: Secondary | ICD-10-CM | POA: Diagnosis not present

## 2021-03-13 DIAGNOSIS — Z79899 Other long term (current) drug therapy: Secondary | ICD-10-CM | POA: Diagnosis not present

## 2021-03-13 DIAGNOSIS — R768 Other specified abnormal immunological findings in serum: Secondary | ICD-10-CM | POA: Diagnosis not present

## 2021-03-13 DIAGNOSIS — M25569 Pain in unspecified knee: Secondary | ICD-10-CM | POA: Diagnosis not present

## 2021-03-13 DIAGNOSIS — M25551 Pain in right hip: Secondary | ICD-10-CM | POA: Diagnosis not present

## 2021-03-13 DIAGNOSIS — M79643 Pain in unspecified hand: Secondary | ICD-10-CM | POA: Diagnosis not present

## 2021-03-13 DIAGNOSIS — M0579 Rheumatoid arthritis with rheumatoid factor of multiple sites without organ or systems involvement: Secondary | ICD-10-CM | POA: Diagnosis not present

## 2021-03-14 DIAGNOSIS — R69 Illness, unspecified: Secondary | ICD-10-CM | POA: Diagnosis not present

## 2021-03-30 DIAGNOSIS — F1910 Other psychoactive substance abuse, uncomplicated: Secondary | ICD-10-CM | POA: Insufficient documentation

## 2021-03-30 DIAGNOSIS — F32A Depression, unspecified: Secondary | ICD-10-CM | POA: Insufficient documentation

## 2021-03-31 ENCOUNTER — Other Ambulatory Visit: Payer: Self-pay

## 2021-03-31 ENCOUNTER — Ambulatory Visit: Payer: 59 | Admitting: Cardiology

## 2021-03-31 VITALS — BP 100/70 | HR 77 | Ht 64.0 in | Wt 139.0 lb

## 2021-03-31 DIAGNOSIS — I10 Essential (primary) hypertension: Secondary | ICD-10-CM

## 2021-03-31 DIAGNOSIS — Z720 Tobacco use: Secondary | ICD-10-CM

## 2021-03-31 DIAGNOSIS — I2510 Atherosclerotic heart disease of native coronary artery without angina pectoris: Secondary | ICD-10-CM | POA: Diagnosis not present

## 2021-03-31 DIAGNOSIS — I2584 Coronary atherosclerosis due to calcified coronary lesion: Secondary | ICD-10-CM | POA: Diagnosis not present

## 2021-03-31 DIAGNOSIS — R0600 Dyspnea, unspecified: Secondary | ICD-10-CM | POA: Diagnosis not present

## 2021-03-31 DIAGNOSIS — R0609 Other forms of dyspnea: Secondary | ICD-10-CM

## 2021-03-31 NOTE — Progress Notes (Signed)
Cardiology Office Note:    Date:  03/31/2021   ID:  Gabrielle Garza, DOB 01/29/1956, MRN [id number]  PCP:  Laurann Montana, MD  Cardiologist:  Thomasene Ripple, DO  Electrophysiologist:  None   Referring MD: Laurann Montana, MD   " I feel short of breath and fatigue at time"  History of Present Illness:    Gabrielle Garza is a 65 y.o. female with a hx of rheumatoid arthritis, vitamin D deficiency, tobacco use, dyslipidemia is here today at the request of her primary care provider to be evaluated for fatigue and dyspnea on exertion.  The patient tells me that she has been able to do her activities but sometimes does get dyspnea on exertion and fatigue.  She denies any chest discomfort lightheadedness or dizziness.  One of her main concern is recently she had a CT scan of the chest which showed evidence of coronary calcification.   Past Medical History:  Diagnosis Date   Depression    Substance abuse (HCC)     Past Surgical History:  Procedure Laterality Date   BUNIONECTOMY     JOINT REPLACEMENT     TUBAL LIGATION      Current Medications: Current Meds  Medication Sig   atorvastatin (LIPITOR) 20 MG tablet Take 20 mg by mouth daily.   b complex vitamins tablet Take 1 tablet by mouth daily.   buPROPion (WELLBUTRIN XL) 150 MG 24 hr tablet Take 150 mg by mouth every morning.   celecoxib (CELEBREX) 200 MG capsule Take 200 mg by mouth daily.   cholecalciferol (VITAMIN D) 1000 UNITS tablet Take 1,000 Units by mouth daily.   folic acid (FOLVITE) 1 MG tablet TK 1 T PO QD   methotrexate (RHEUMATREX) 2.5 MG tablet Takes 8 - 2.5 mg tablets a week   sertraline (ZOLOFT) 100 MG tablet Take 100 mg by mouth daily.     Allergies:   Flonase [fluticasone propionate] and Plaquenil [hydroxychloroquine]   Social History   Socioeconomic History   Marital status: Divorced    Spouse name: Not on file   Number of children: Not on file   Years of education: Not on file   Highest education level:  Not on file  Occupational History   Not on file  Tobacco Use   Smoking status: Every Day    Packs/day: 0.75    Years: 46.00    Pack years: 34.50    Types: Cigarettes   Smokeless tobacco: Never  Substance and Sexual Activity   Alcohol use: No   Drug use: No   Sexual activity: Never    Birth control/protection: Abstinence  Other Topics Concern   Not on file  Social History Narrative   Not on file   Social Determinants of Health   Financial Resource Strain: Not on file  Food Insecurity: Not on file  Transportation Needs: Not on file  Physical Activity: Not on file  Stress: Not on file  Social Connections: Not on file     Family History: The patient's family history is not on file.  ROS:   Review of Systems  Constitution: Negative for decreased appetite, fever and weight gain.  HENT: Negative for congestion, ear discharge, hoarse voice and sore throat.   Eyes: Negative for discharge, redness, vision loss in right eye and visual halos.  Cardiovascular: Negative for chest pain, dyspnea on exertion, leg swelling, orthopnea and palpitations.  Respiratory: Negative for cough, hemoptysis, shortness of breath and snoring.   Endocrine: Negative for heat  intolerance and polyphagia.  Hematologic/Lymphatic: Negative for bleeding problem. Does not bruise/bleed easily.  Skin: Negative for flushing, nail changes, rash and suspicious lesions.  Musculoskeletal: Negative for arthritis, joint pain, muscle cramps, myalgias, neck pain and stiffness.  Gastrointestinal: Negative for abdominal pain, bowel incontinence, diarrhea and excessive appetite.  Genitourinary: Negative for decreased libido, genital sores and incomplete emptying.  Neurological: Negative for brief paralysis, focal weakness, headaches and loss of balance.  Psychiatric/Behavioral: Negative for altered mental status, depression and suicidal ideas.  Allergic/Immunologic: Negative for HIV exposure and persistent infections.     EKGs/Labs/Other Studies Reviewed:    The following studies were reviewed today:   EKG:  The ekg ordered today demonstrates monitoring, heart rate 77 bpm with underlying left bundle branch block.  Recent Labs: No results found for requested labs within last 8760 hours.  Recent Lipid Panel No results found for: CHOL, TRIG, HDL, CHOLHDL, VLDL, LDLCALC, LDLDIRECT  Physical Exam:    VS:  BP 100/70 (BP Location: Left Arm, Patient Position: Sitting, Cuff Size: Normal)   Pulse 77   Ht 5\' 4"  (1.626 m)   Wt 139 lb 0.6 oz (63.1 kg)   SpO2 95%   BMI 23.87 kg/m     Wt Readings from Last 3 Encounters:  03/31/21 139 lb 0.6 oz (63.1 kg)  10/15/18 136 lb 9.6 oz (62 kg)  10/06/12 135 lb (61.2 kg)     GEN: Well nourished, well developed in no acute distress HEENT: Normal NECK: No JVD; No carotid bruits LYMPHATICS: No lymphadenopathy CARDIAC: S1S2 noted,RRR, no murmurs, rubs, gallops RESPIRATORY:  Clear to auscultation without rales, wheezing or rhonchi  ABDOMEN: Soft, non-tender, non-distended, +bowel sounds, no guarding. EXTREMITIES: No edema, No cyanosis, no clubbing MUSCULOSKELETAL:  No deformity  SKIN: Warm and dry NEUROLOGIC:  Alert and oriented x 3, non-focal PSYCHIATRIC:  Normal affect, good insight  ASSESSMENT:    1. DOE (dyspnea on exertion)   2. Hypertension, unspecified type   3. Coronary artery calcification   4. Tobacco use    PLAN:    Unfortunately I do not have a copy of her CT scan however to have her PCP notes which refers to this multivessel coronary calcification on her chest CT.  With her symptoms of dyspnea and fatigue I like to proceed with ischemic evaluation in this patient pharmacologic nuclear stress test will be appropriate at this time.  I talked to the patient about the test and she is agreeable to proceed.  Smoking cessation advised.  Hyperlipidemia - continue with current statin medication.  The patient is in agreement with the above plan. The  patient left the office in stable condition.  The patient will follow up in 1 year or sooner if needed.   Medication Adjustments/Labs and Tests Ordered: Current medicines are reviewed at length with the patient today.  Concerns regarding medicines are outlined above.  Orders Placed This Encounter  Procedures   MYOCARDIAL PERFUSION IMAGING   EKG 12-Lead    No orders of the defined types were placed in this encounter.   Patient Instructions  Medication Instructions:  Your physician recommends that you continue on your current medications as directed. Please refer to the Current Medication list given to you today.  *If you need a refill on your cardiac medications before your next appointment, please call your pharmacy*   Lab Work: None If you have labs (blood work) drawn today and your tests are completely normal, you will receive your results only by: MyChart Message (if  you have MyChart) OR A paper copy in the mail If you have any lab test that is abnormal or we need to change your treatment, we will call you to review the results.   Testing/Procedures: Your physician has requested that you have a lexiscan myoview. For further information please visit [email or web address] Please follow instruction sheet, as given.    Follow-Up: At [facility], you and your health needs are our priority.  As part of our continuing mission to provide you with exceptional heart care, we have created designated Provider Care Teams.  These Care Teams include your primary Cardiologist (physician) and Advanced Practice Providers (APPs -  Physician Assistants and Nurse Practitioners) who all work together to provide you with the care you need, when you need it.  We recommend signing up for the patient portal called "MyChart".  Sign up information is provided on this After Visit Summary.  MyChart is used to connect with patients for Virtual Visits (Telemedicine).  Patients are able to view lab/test  results, encounter notes, upcoming appointments, etc.  Non-urgent messages can be sent to your provider as well.   To learn more about what you can do with MyChart, go to [email or web address].    Your next appointment:   12 month(s)  The format for your next appointment:   In Person  Provider:   [address] - Thomasene Ripple, DO    Other Instructions   [facility] at [facility] [address], [address] [place], [place] [address] Phone: [phone]    Please arrive 15 minutes prior to your appointment time for registration and insurance purposes.  The test will take approximately 3 to 4 hours to complete; you may bring reading material.  If someone comes with you to your appointment, they will need to remain in the main lobby due to limited space in the testing area. **If you are pregnant or breastfeeding, please notify the nuclear lab prior to your appointment**  How to prepare for your Myocardial Perfusion Test: Do not eat or drink 3 hours prior to your test, except you may have water. Do not consume products containing caffeine (regular or decaffeinated) 12 hours prior to your test. (ex: coffee, chocolate, sodas, tea). Do bring a list of your current medications with you.  If not listed below, you may take your medications as normal. Do wear comfortable clothes (no dresses or overalls) and walking shoes, tennis shoes preferred (No heels or open toe shoes are allowed). Do NOT wear cologne, perfume, aftershave, or lotions (deodorant is allowed). If these instructions are not followed, your test will have to be rescheduled.  Please report to [address], [address] for your test.  If you have questions or concerns about your appointment, you can call the Nuclear Lab at [phone].  If you cannot keep your appointment, please provide 24 hours notification to the Nuclear Lab, to avoid a possible $50 charge to your account.    Adopting a  Healthy Lifestyle.  Know what a healthy weight is for you (roughly BMI <25) and aim to maintain this   Aim for 7+ servings of fruits and vegetables daily   65-80+ fluid ounces of water or unsweet tea for healthy kidneys   Limit to max 1 drink of alcohol per day; avoid smoking/tobacco   Limit animal fats in diet for cholesterol and heart health - choose grass fed whenever available   Avoid highly processed foods, and foods high in saturated/trans fats  Aim for low stress - take time to unwind and care for your mental health   Aim for 150 min of moderate intensity exercise weekly for heart health, and weights twice weekly for bone health   Aim for 7-9 hours of sleep daily   When it comes to diets, agreement about the perfect plan isnt easy to find, even among the experts. Experts at the [facility] of [employer] developed an idea known as the Healthy Eating Plate. Just imagine a plate divided into logical, healthy portions.   The emphasis is on diet quality:   Load up on vegetables and fruits - one-half of your plate: Aim for color and variety, and remember that potatoes dont count.   Go for whole grains - one-quarter of your plate: Whole wheat, barley, wheat berries, quinoa, oats, brown rice, and foods made with them. If you want pasta, go with whole wheat pasta.   Protein power - one-quarter of your plate: Fish, chicken, beans, and nuts are all healthy, versatile protein sources. Limit red meat.   The diet, however, does go beyond the plate, offering a few other suggestions.   Use healthy plant oils, such as olive, canola, soy, corn, sunflower and peanut. Check the labels, and avoid partially hydrogenated oil, which have unhealthy trans fats.   If youre thirsty, drink water. Coffee and tea are good in moderation, but skip sugary drinks and limit milk and dairy products to one or two daily servings.   The type of carbohydrate in the diet is more important than the  amount. Some sources of carbohydrates, such as vegetables, fruits, whole grains, and beans-are healthier than others.   Finally, stay active  Signed, Thomasene Ripple, DO  03/31/2021 10:52 AM    [facility]

## 2021-03-31 NOTE — Patient Instructions (Signed)
Medication Instructions:  Your physician recommends that you continue on your current medications as directed. Please refer to the Current Medication list given to you today.  *If you need a refill on your cardiac medications before your next appointment, please call your pharmacy*   Lab Work: None If you have labs (blood work) drawn today and your tests are completely normal, you will receive your results only by: MyChart Message (if you have MyChart) OR A paper copy in the mail If you have any lab test that is abnormal or we need to change your treatment, we will call you to review the results.   Testing/Procedures: Your physician has requested that you have a lexiscan myoview. For further information please visit [email or web address] Please follow instruction sheet, as given.    Follow-Up: At [facility], you and your health needs are our priority.  As part of our continuing mission to provide you with exceptional heart care, we have created designated Provider Care Teams.  These Care Teams include your primary Cardiologist (physician) and Advanced Practice Providers (APPs -  Physician Assistants and Nurse Practitioners) who all work together to provide you with the care you need, when you need it.  We recommend signing up for the patient portal called "MyChart".  Sign up information is provided on this After Visit Summary.  MyChart is used to connect with patients for Virtual Visits (Telemedicine).  Patients are able to view lab/test results, encounter notes, upcoming appointments, etc.  Non-urgent messages can be sent to your provider as well.   To learn more about what you can do with MyChart, go to [email or web address].    Your next appointment:   12 month(s)  The format for your next appointment:   In Person  Provider:   [address] - Thomasene Ripple, DO    Other Instructions   [facility] Cardiovascular Imaging at [facility] [address], [address] [place], [place] [address] Phone: [phone]    Please arrive 15 minutes prior to your appointment time for registration and insurance purposes.  The test will take approximately 3 to 4 hours to complete; you may bring reading material.  If someone comes with you to your appointment, they will need to remain in the main lobby due to limited space in the testing area. **If you are pregnant or breastfeeding, please notify the nuclear lab prior to your appointment**  How to prepare for your Myocardial Perfusion Test: Do not eat or drink 3 hours prior to your test, except you may have water. Do not consume products containing caffeine (regular or decaffeinated) 12 hours prior to your test. (ex: coffee, chocolate, sodas, tea). Do bring a list of your current medications with you.  If not listed below, you may take your medications as normal. Do wear comfortable clothes (no dresses or overalls) and walking shoes, tennis shoes preferred (No heels or open toe shoes are allowed). Do NOT wear cologne, perfume, aftershave, or lotions (deodorant is allowed). If these instructions are not followed, your test will have to be rescheduled.  Please report to [address] for your test.  If you have questions or concerns about your appointment, you can call the Nuclear Lab at [phone].  If you cannot keep your appointment, please provide 24 hours notification to the Nuclear Lab, to avoid a possible $50 charge to your account.

## 2021-04-05 DIAGNOSIS — M810 Age-related osteoporosis without current pathological fracture: Secondary | ICD-10-CM | POA: Diagnosis not present

## 2021-04-05 DIAGNOSIS — R69 Illness, unspecified: Secondary | ICD-10-CM | POA: Diagnosis not present

## 2021-04-05 DIAGNOSIS — F419 Anxiety disorder, unspecified: Secondary | ICD-10-CM | POA: Diagnosis not present

## 2021-04-11 ENCOUNTER — Telehealth (HOSPITAL_COMMUNITY): Payer: Self-pay

## 2021-04-11 NOTE — Telephone Encounter (Signed)
Spoke with the patient, detailed instructions given. She stated that she would be here for her test. Asked to call back with any questions. S.Vanna Sailer EMTP 

## 2021-04-13 ENCOUNTER — Ambulatory Visit (HOSPITAL_COMMUNITY): Payer: 59 | Attending: Internal Medicine

## 2021-04-13 ENCOUNTER — Other Ambulatory Visit: Payer: Self-pay

## 2021-04-13 DIAGNOSIS — R0600 Dyspnea, unspecified: Secondary | ICD-10-CM | POA: Insufficient documentation

## 2021-04-13 DIAGNOSIS — R0609 Other forms of dyspnea: Secondary | ICD-10-CM

## 2021-04-13 LAB — MYOCARDIAL PERFUSION IMAGING
LV dias vol: 94 mL (ref 46–106)
LV sys vol: 43 mL
Peak HR: 108 {beats}/min
Rest HR: 64 {beats}/min
SDS: 2
SRS: 2
SSS: 4
TID: 1.09

## 2021-04-13 MED ORDER — TECHNETIUM TC 99M TETROFOSMIN IV KIT
32.9000 | PACK | Freq: Once | INTRAVENOUS | Status: AC | PRN
  Administered 2021-04-13: 32.9 via INTRAVENOUS
  Filled 2021-04-13: qty 33

## 2021-04-13 MED ORDER — REGADENOSON 0.4 MG/5ML IV SOLN
0.4000 mg | Freq: Once | INTRAVENOUS | Status: AC
  Administered 2021-04-13: 0.4 mg via INTRAVENOUS

## 2021-04-13 MED ORDER — TECHNETIUM TC 99M TETROFOSMIN IV KIT
9.9000 | PACK | Freq: Once | INTRAVENOUS | Status: AC | PRN
  Administered 2021-04-13: 9.9 via INTRAVENOUS
  Filled 2021-04-13: qty 10

## 2021-04-14 ENCOUNTER — Telehealth: Payer: Self-pay

## 2021-04-14 NOTE — Telephone Encounter (Signed)
Spoke with patient regarding results and recommendation.  Patient verbalizes understanding and is agreeable to plan of care. Advised patient to call back with any issues or concerns.  

## 2021-04-14 NOTE — Telephone Encounter (Signed)
-----   Message from Baldo Daub, MD sent at 04/14/2021  1:16 PM EDT ----- Normal or stable result  Is a good result her Myoview was normal

## 2021-04-18 DIAGNOSIS — R69 Illness, unspecified: Secondary | ICD-10-CM | POA: Diagnosis not present

## 2021-05-30 DIAGNOSIS — R69 Illness, unspecified: Secondary | ICD-10-CM | POA: Diagnosis not present

## 2021-06-13 DIAGNOSIS — M1990 Unspecified osteoarthritis, unspecified site: Secondary | ICD-10-CM | POA: Diagnosis not present

## 2021-06-13 DIAGNOSIS — M25551 Pain in right hip: Secondary | ICD-10-CM | POA: Diagnosis not present

## 2021-06-13 DIAGNOSIS — Z79899 Other long term (current) drug therapy: Secondary | ICD-10-CM | POA: Diagnosis not present

## 2021-06-13 DIAGNOSIS — R768 Other specified abnormal immunological findings in serum: Secondary | ICD-10-CM | POA: Diagnosis not present

## 2021-06-13 DIAGNOSIS — M79643 Pain in unspecified hand: Secondary | ICD-10-CM | POA: Diagnosis not present

## 2021-06-13 DIAGNOSIS — M810 Age-related osteoporosis without current pathological fracture: Secondary | ICD-10-CM | POA: Diagnosis not present

## 2021-06-13 DIAGNOSIS — M0579 Rheumatoid arthritis with rheumatoid factor of multiple sites without organ or systems involvement: Secondary | ICD-10-CM | POA: Diagnosis not present

## 2021-07-18 DIAGNOSIS — R69 Illness, unspecified: Secondary | ICD-10-CM | POA: Diagnosis not present

## 2021-08-22 DIAGNOSIS — R69 Illness, unspecified: Secondary | ICD-10-CM | POA: Diagnosis not present

## 2021-10-10 DIAGNOSIS — R69 Illness, unspecified: Secondary | ICD-10-CM | POA: Diagnosis not present

## 2021-10-12 DIAGNOSIS — M79643 Pain in unspecified hand: Secondary | ICD-10-CM | POA: Diagnosis not present

## 2021-10-12 DIAGNOSIS — M1990 Unspecified osteoarthritis, unspecified site: Secondary | ICD-10-CM | POA: Diagnosis not present

## 2021-10-12 DIAGNOSIS — M810 Age-related osteoporosis without current pathological fracture: Secondary | ICD-10-CM | POA: Diagnosis not present

## 2021-10-12 DIAGNOSIS — R768 Other specified abnormal immunological findings in serum: Secondary | ICD-10-CM | POA: Diagnosis not present

## 2021-10-12 DIAGNOSIS — M25551 Pain in right hip: Secondary | ICD-10-CM | POA: Diagnosis not present

## 2021-10-12 DIAGNOSIS — M0579 Rheumatoid arthritis with rheumatoid factor of multiple sites without organ or systems involvement: Secondary | ICD-10-CM | POA: Diagnosis not present

## 2021-10-12 DIAGNOSIS — Z79899 Other long term (current) drug therapy: Secondary | ICD-10-CM | POA: Diagnosis not present

## 2021-10-12 DIAGNOSIS — K219 Gastro-esophageal reflux disease without esophagitis: Secondary | ICD-10-CM | POA: Diagnosis not present

## 2021-11-07 DIAGNOSIS — L578 Other skin changes due to chronic exposure to nonionizing radiation: Secondary | ICD-10-CM | POA: Diagnosis not present

## 2021-11-07 DIAGNOSIS — D224 Melanocytic nevi of scalp and neck: Secondary | ICD-10-CM | POA: Diagnosis not present

## 2021-11-07 DIAGNOSIS — L814 Other melanin hyperpigmentation: Secondary | ICD-10-CM | POA: Diagnosis not present

## 2021-11-07 DIAGNOSIS — D2239 Melanocytic nevi of other parts of face: Secondary | ICD-10-CM | POA: Diagnosis not present

## 2021-11-07 DIAGNOSIS — D225 Melanocytic nevi of trunk: Secondary | ICD-10-CM | POA: Diagnosis not present

## 2021-11-07 DIAGNOSIS — R69 Illness, unspecified: Secondary | ICD-10-CM | POA: Diagnosis not present

## 2021-11-07 DIAGNOSIS — L821 Other seborrheic keratosis: Secondary | ICD-10-CM | POA: Diagnosis not present

## 2021-11-08 HISTORY — PX: TOOTH EXTRACTION: SUR596

## 2021-11-29 DIAGNOSIS — M1611 Unilateral primary osteoarthritis, right hip: Secondary | ICD-10-CM | POA: Diagnosis not present

## 2021-12-08 ENCOUNTER — Other Ambulatory Visit: Payer: Self-pay | Admitting: Orthopedic Surgery

## 2021-12-12 ENCOUNTER — Telehealth: Payer: Self-pay

## 2021-12-12 DIAGNOSIS — R69 Illness, unspecified: Secondary | ICD-10-CM | POA: Diagnosis not present

## 2021-12-12 NOTE — Telephone Encounter (Signed)
? ? ?  Name: Gabrielle Garza  ?DOB: May 25, 1956  ?MRN: [id number] ? ?Primary Cardiologist: Thomasene Ripple, DO ? ? ?Preoperative team, please contact this patient and set up a phone call appointment for further preoperative risk assessment. Please obtain consent and complete medication review. Thank you for your help. ? ? ?Marcelino Duster, PA-C ?12/12/2021, 5:27 PM ?[phone] ?[facility] ?[address] ?[place], [place] [address] ? ? ?

## 2021-12-12 NOTE — Telephone Encounter (Signed)
? ?  Pre-operative Risk Assessment  ?  ?Patient Name: Gabrielle Garza  ?DOB: April 19, 1956 ?MRN: [id number]  ? ?  ? ?Request for Surgical Clearance   ? ?Procedure:  RIGHT TOTAL HIP ARTHROPLASTY ANTERIOR APPROACH ? ?Date of Surgery:  Clearance 01/01/22                              ?   ?Surgeon:  Frederik Pear, MD ?Surgeon's Group or Practice Name:  [facility] ?Phone number:  [phone] ?Fax number:  [phone] ?  ?Type of Clearance Requested:   ?- Medical  ?  ?Type of Anesthesia:  Spinal ?  ?Additional requests/questions:   ? ? ?

## 2021-12-13 ENCOUNTER — Telehealth: Payer: Self-pay | Admitting: *Deleted

## 2021-12-13 NOTE — Telephone Encounter (Addendum)
S/w the pt and she is agreeable to plan of care for tele pre op appt 12/14/21 @ 2 pm. Med rec and consent are done.  ? ?Will send FYI to surgeon's office pt has appt 12/14/21 ?

## 2021-12-13 NOTE — Telephone Encounter (Signed)
MED REC AND CONSENT DONE. ? ?  ?Patient Consent for Virtual Visit  ? ? ?   ? ?Gabrielle Garza has provided verbal consent on 12/13/2021 for a virtual visit (video or telephone). ? ? ?CONSENT FOR VIRTUAL VISIT FOR:  Gabrielle Garza  ?By participating in this virtual visit I agree to the following: ? ?I hereby voluntarily request, consent and authorize [facility] and its employed or contracted physicians, physician assistants, nurse practitioners or other licensed health care professionals (the Practitioner), to provide me with telemedicine health care services (the ?Services") as deemed necessary by the treating Practitioner. I acknowledge and consent to receive the Services by the Practitioner via telemedicine. I understand that the telemedicine visit will involve communicating with the Practitioner through live audiovisual communication technology and the disclosure of certain medical information by electronic transmission. I acknowledge that I have been given the opportunity to request an in-person assessment or other available alternative prior to the telemedicine visit and am voluntarily participating in the telemedicine visit. ? ?I understand that I have the right to withhold or withdraw my consent to the use of telemedicine in the course of my care at any time, without affecting my right to future care or treatment, and that the Practitioner or I may terminate the telemedicine visit at any time. I understand that I have the right to inspect all information obtained and/or recorded in the course of the telemedicine visit and may receive copies of available information for a reasonable fee.  I understand that some of the potential risks of receiving the Services via telemedicine include:  ?Delay or interruption in medical evaluation due to technological equipment failure or disruption; ?Information transmitted may not be sufficient (e.g. poor resolution of images) to allow for appropriate medical decision  making by the Practitioner; and/or  ?In rare instances, security protocols could fail, causing a breach of personal health information. ? ?Furthermore, I acknowledge that it is my responsibility to provide information about my medical history, conditions and care that is complete and accurate to the best of my ability. I acknowledge that Practitioner's advice, recommendations, and/or decision may be based on factors not within their control, such as incomplete or inaccurate data provided by me or distortions of diagnostic images or specimens that may result from electronic transmissions. I understand that the practice of medicine is not an exact science and that Practitioner makes no warranties or guarantees regarding treatment outcomes. I acknowledge that a copy of this consent can be made available to me via my patient portal ([place]), or I can request a printed copy by calling the office of [facility].   ? ?I understand that my insurance will be billed for this visit.  ? ?I have read or had this consent read to me. ?I understand the contents of this consent, which adequately explains the benefits and risks of the Services being provided via telemedicine.  ?I have been provided ample opportunity to ask questions regarding this consent and the Services and have had my questions answered to my satisfaction. ?I give my informed consent for the services to be provided through the use of telemedicine in my medical care ? ? ? ?

## 2021-12-13 NOTE — Telephone Encounter (Signed)
Tried to reach the pt to set up tele pre op visit. Recording came on Software engineer, said please state your reason for the call and then stated person not available and hung up. I will send FYI to requesting office in hopes if they s/w the pt that if they could let her know to call her cardiologist office for appt for pre op .  ?

## 2021-12-14 ENCOUNTER — Other Ambulatory Visit: Payer: Self-pay

## 2021-12-14 ENCOUNTER — Ambulatory Visit (INDEPENDENT_AMBULATORY_CARE_PROVIDER_SITE_OTHER): Payer: 59 | Admitting: General Practice

## 2021-12-14 DIAGNOSIS — Z01810 Encounter for preprocedural cardiovascular examination: Secondary | ICD-10-CM

## 2021-12-14 NOTE — Progress Notes (Signed)
? ?Virtual Visit via Telephone Note  ? ?This visit type was conducted due to national recommendations for restrictions regarding the COVID-19 Pandemic (e.g. social distancing) in an effort to limit this patient's exposure and mitigate transmission in our community.  Due to her co-morbid illnesses, this patient is at least at moderate risk for complications without adequate follow up.  This format is felt to be most appropriate for this patient at this time.  The patient did not have access to video technology/had technical difficulties with video requiring transitioning to audio format only (telephone).  All issues noted in this document were discussed and addressed.  No physical exam could be performed with this format.  Please refer to the patient's chart for her  consent to telehealth for [facility]. ?Evaluation Performed:  Preoperative cardiovascular risk assessment ? ?This visit type was conducted due to national recommendations for restrictions regarding the COVID-19 Pandemic (e.g. social distancing).  This format is felt to be most appropriate for this patient at this time.  All issues noted in this document were discussed and addressed.  No physical exam was performed (except for noted visual exam findings with Video Visits).  Please refer to the patient's chart (MyChart message for video visits and phone note for telephone visits) for the patient's consent to telehealth for [facility]. ?_____________  ? ?Date:  12/14/2021  ? ?Patient ID:  Gabrielle Garza, Gabrielle Garza 1955-09-29, MRN [id number] ?Patient Location:  ?Home ?Provider location:   ?Office ? ?Primary Care Provider:  Laurann Montana, MD ?Primary Cardiologist:  Thomasene Ripple, DO ? ?Chief Complaint  ?  ?66 y.o. y/o female with a h/o dyspnea on exertion, hypertension, coronary calcification, and tobacco use, who is pending right total hip arthroplasty, and presents today for telephonic preoperative cardiovascular risk assessment. ? ?Past Medical History   ?  ?Past Medical History:  ?Diagnosis Date  ? Depression   ? Substance abuse (HCC)   ? ?Past Surgical History:  ?Procedure Laterality Date  ? BUNIONECTOMY    ? JOINT REPLACEMENT    ? TUBAL LIGATION    ? ? ?Allergies ? ?Allergies  ?Allergen Reactions  ? Flonase [Fluticasone Propionate] Other (See Comments)  ?  Headache   ? Plaquenil [Hydroxychloroquine]   ?  Pt stated, "This made me feel dizzy"  ? ? ?History of Present Illness  ?  ?Gabrielle Garza is a 66 y.o. female who presents via audio/video conferencing for a telehealth visit today.  Pt was last seen in cardiology clinic on 03/31/2021, by Dr. Servando Salina.  At that time Gabrielle Garza was being evaluated for DOE.  Her stress test was normal.  she is now pending right total hip arthroplasty.  Since his last visit, she remained stable from a cardiac standpoint. ? ?Today she denies chest pain, shortness of breath, lower extremity edema, fatigue, palpitations, melena, hematuria, hemoptysis, diaphoresis, weakness, presyncope, syncope, orthopnea, and PND. ? ? ? ?Home Medications  ?  ?Prior to Admission medications   ?Medication Sig Start Date End Date Taking? Authorizing Provider  ?atorvastatin (LIPITOR) 20 MG tablet Take 20 mg by mouth daily.    [provider]  ?b complex vitamins tablet Take 1 tablet by mouth daily.    [provider]  ?buPROPion (WELLBUTRIN XL) 150 MG 24 hr tablet Take 150 mg by mouth every morning. 03/06/21   [provider]  ?celecoxib (CELEBREX) 200 MG capsule Take 200 mg by mouth daily. 03/16/21   [provider]  ?cholecalciferol (VITAMIN D) 1000  UNITS tablet Take 1,000 Units by mouth daily.    [provider]  ?folic acid (FOLVITE) 1 MG tablet TK 1 T PO QD 09/10/18   [provider]  ?methotrexate (RHEUMATREX) 2.5 MG tablet Takes 8 - 2.5 mg tablets a week 09/10/18   [provider]  ?sertraline (ZOLOFT) 100 MG tablet Take 100 mg by mouth daily.    [provider]  ? ? ?Physical  Exam  ?  ?Vital Signs:  Gabrielle Garza does not have vital signs available for review today. ? ?Given telephonic nature of communication, physical exam is limited. ?AAOx3. NAD. Normal affect.  Speech and respirations are unlabored. ? ?Accessory Clinical Findings  ?  ?None ? ?Assessment & Plan  ?  ?1.  Preoperative Cardiovascular Risk Assessment: ? ?  ? ?Primary Cardiologist: Thomasene Ripple, DO ? ?Chart reviewed as part of pre-operative protocol coverage. Given past medical history and time since last visit, based on ACC/AHA guidelines, Gabrielle Garza would be at acceptable risk for the planned procedure without further cardiovascular testing.  ? ?Patient was advised that if she develops new symptoms prior to surgery to contact our office to arrange a follow-up appointment.  He verbalized understanding. ? ? ?Her RCRI is a class I risk, 0.4% risk of major cardiac event.  She is able to complete greater than 4 METS of physical activity. ? ? ?COVID-19 Education: ?The signs and symptoms of COVID-19 were discussed with the patient and how to seek care for testing (follow up with PCP or arrange E-visit).  The importance of social distancing was discussed today. ? ?Patient Risk:   ?After full review of this patient's history and clinical status, I feel that he is at least moderate risk for cardiac complications at this time, thus necessitating a telehealth visit sooner than our first available in office visit. ? ?Time:   ?Today, I have spent 5 minutes with the patient with telehealth technology discussing medical history, symptoms, and management plan.  Prior to the phone visit I spent greater than 10 minutes reviewing the patient's past medical history and medications. ? ? ?Ronney Asters, NP ? ?12/14/2021, 12:35 PM ? ?

## 2021-12-18 DIAGNOSIS — Z01818 Encounter for other preprocedural examination: Secondary | ICD-10-CM | POA: Diagnosis not present

## 2021-12-18 DIAGNOSIS — M810 Age-related osteoporosis without current pathological fracture: Secondary | ICD-10-CM | POA: Diagnosis not present

## 2021-12-18 DIAGNOSIS — M1611 Unilateral primary osteoarthritis, right hip: Secondary | ICD-10-CM | POA: Diagnosis not present

## 2021-12-18 DIAGNOSIS — R69 Illness, unspecified: Secondary | ICD-10-CM | POA: Diagnosis not present

## 2021-12-18 DIAGNOSIS — I447 Left bundle-branch block, unspecified: Secondary | ICD-10-CM | POA: Diagnosis not present

## 2021-12-18 NOTE — Patient Instructions (Addendum)
DUE TO COVID-19 ONLY ONE VISITOR  (aged 65 and older)  IS ALLOWED TO COME WITH YOU AND STAY IN THE WAITING ROOM ONLY DURING PRE OP AND PROCEDURE.   ?**NO VISITORS ARE ALLOWED IN THE SHORT STAY AREA OR RECOVERY ROOM!!** ? ?IF YOU WILL BE ADMITTED INTO THE HOSPITAL YOU ARE ALLOWED ONLY TWO SUPPORT PEOPLE DURING VISITATION HOURS ONLY (7 AM -8PM)   ?The support person(s) must pass our screening, gel in and out, and wear a mask at all times, including in the patient?s room. ?Patients must also wear a mask when staff or their support person are in the room. ?Visitors GUEST BADGE MUST BE WORN VISIBLY  ?One adult visitor may remain with you overnight and MUST be in the room by 8 P.M. ?  ? ? Your procedure is scheduled on: 01/01/22 ? ? Report to [facility] Main Entrance ? ?  Report to short stay at 5:15 AM ? ? Call this number if you have problems the morning of surgery [phone] ? ? Do not eat food :After Midnight. ? ? After Midnight you may have the following liquids until _4:15_____ AM DAY OF SURGERY ? ?Water ?Black Coffee (sugar ok, NO MILK/CREAM OR CREAMERS)  ?Tea (sugar ok, NO MILK/CREAM OR CREAMERS) regular and decaf                             ?Plain Jell-O (NO RED)                                           ?Fruit ices (not with fruit pulp, NO RED)                                     ?Popsicles (NO RED)                                                                  ?Juice: apple, WHITE grape, WHITE cranberry ?Sports drinks like Gatorade (NO RED) ?Clear broth(vegetable,chicken,beef) ? ?              ? ?  ?  ?The day of surgery:  ?Drink ONE (1) Pre-Surgery Clear Ensure  at 4:00 AM the morning of surgery. Drink in one sitting. Do not sip.  ?This drink was given to you during your hospital  ?pre-op appointment visit. ?Nothing else to drink after completing the  ?Pre-Surgery Clear Ensure at 4:14 ?  ?       If you have questions, please contact your surgeon?s office. ? ? ? Oral Hygiene is also important to  reduce your risk of infection.                                    ?Remember - BRUSH YOUR TEETH THE MORNING OF SURGERY WITH YOUR REGULAR TOOTHPASTE ? ? Do NOT smoke after Midnight ? ? Take these medicines the morning of surgery with A SIP OF WATER: Bupropion, Zoloft ? ? ?                  ?  You may not have any metal on your body including hair pins, jewelry, and body piercing ? ?           Do not wear make-up, lotions, powders, perfumes/cologne, or deodorant ? ?Do not wear nail polish including gel and S&S, artificial/acrylic nails, or any other type of covering on natural nails including finger and toenails. If you have artificial nails, gel coating, etc. that needs to be removed by a nail salon please have this removed prior to surgery or surgery may need to be canceled/ delayed if the surgeon/ anesthesia feels like they are unable to be safely monitored.  ? ?Do not shave  48 hours prior to surgery.  ? ? ? Do not bring valuables to the hospital. [facility] IS NOT ?            RESPONSIBLE   FOR VALUABLES. ? ? Contacts, dentures or bridgework may not be worn into surgery. ? ?  ? Patients discharged on the day of surgery will not be allowed to drive home.  Someone NEEDS to stay with you for the first 24 hours after anesthesia. ? ? Special Instructions: Bring a copy of your healthcare power of attorney and living will documents the day of surgery if you haven't scanned them before. ? ?            Please read over the following fact sheets you were given: IF YOU HAVE QUESTIONS ABOUT YOUR PRE-OP INSTRUCTIONS PLEASE CALL [phone] ? ?   [facility] - Preparing for Surgery ?Before surgery, you can play an important role.  Because skin is not sterile, your skin needs to be as free of germs as possible.  You can reduce the number of germs on your skin by washing with CHG (chlorahexidine gluconate) soap before surgery.  CHG is an antiseptic cleaner which kills germs and bonds with the skin to continue killing  germs even after washing. ?Please DO NOT use if you have an allergy to CHG or antibacterial soaps.  If your skin becomes reddened/irritated stop using the CHG and inform your nurse when you arrive at Short Stay. ?Do not shave (including legs and underarms) for at least 48 hours prior to the first CHG shower.   ?Please follow these instructions carefully: ? 1.  Shower with CHG Soap the night before surgery and the  morning of Surgery. ? 2.  If you choose to wash your hair, wash your hair first as usual with your  normal  shampoo. ? 3.  After you shampoo, rinse your hair and body thoroughly to remove the  shampoo.                  ?          4.  Use CHG as you would any other liquid soap.  You can apply chg directly  to the skin and wash  ?                     Gently with a scrungie or clean washcloth. ? 5.  Apply the CHG Soap to your body ONLY FROM THE NECK DOWN.   Do not use on face/ open      ?                     Wound or open sores. Avoid contact with eyes, ears mouth and genitals (private parts).  ?  Wash face,  Genitals (private parts) with your normal soap. ?            6.  Wash thoroughly, paying special attention to the area where your surgery  will be performed. ? 7.  Thoroughly rinse your body with warm water from the neck down. ? 8.  DO NOT shower/wash with your normal soap after using and rinsing off  the CHG Soap. ?               9.  Pat yourself dry with a clean towel. ?           10.  Wear clean pajamas. ?           11.  Place clean sheets on your bed the night of your first shower and do not  sleep with pets. ?Day of Surgery : ?Do not apply any lotions/deodorants the morning of surgery.  Please wear clean clothes to the hospital/surgery center. ? ?FAILURE TO FOLLOW THESE INSTRUCTIONS MAY RESULT IN THE CANCELLATION OF YOUR SURGERY ? ? ? ?________________________________________________________________________  ? ?Incentive Spirometer ? ?An incentive spirometer is a tool that can  help keep your lungs clear and active. This tool measures how well you are filling your lungs with each breath. Taking long deep breaths may help reverse or decrease the chance of developing breathing (pulmonary) problems (especially infection) following: ?A long period of time when you are unable to move or be active. ?BEFORE THE PROCEDURE  ?If the spirometer includes an indicator to show your best effort, your nurse or respiratory therapist will set it to a desired goal. ?If possible, sit up straight or lean slightly forward. Try not to slouch. ?Hold the incentive spirometer in an upright position. ?INSTRUCTIONS FOR USE  ?Sit on the edge of your bed if possible, or sit up as far as you can in bed or on a chair. ?Hold the incentive spirometer in an upright position. ?Breathe out normally. ?Place the mouthpiece in your mouth and seal your lips tightly around it. ?Breathe in slowly and as deeply as possible, raising the piston or the ball toward the top of the column. ?Hold your breath for 3-5 seconds or for as long as possible. Allow the piston or ball to fall to the bottom of the column. ?Remove the mouthpiece from your mouth and breathe out normally. ?Rest for a few seconds and repeat Steps 1 through 7 at least 10 times every 1-2 hours when you are awake. Take your time and take a few normal breaths between deep breaths. ?The spirometer may include an indicator to show your best effort. Use the indicator as a goal to work toward during each repetition. ?After each set of 10 deep breaths, practice coughing to be sure your lungs are clear. If you have an incision (the cut made at the time of surgery), support your incision when coughing by placing a pillow or rolled up towels firmly against it. ?Once you are able to get out of bed, walk around indoors and cough well. You may stop using the incentive spirometer when instructed by your caregiver.  ?RISKS AND COMPLICATIONS ?Take your time so you do not get dizzy or  light-headed. ?If you are in pain, you may need to take or ask for pain medication before doing incentive spirometry. It is harder to take a deep breath if you are having pain. ?AFTER USE ?Rest and breathe slo

## 2021-12-19 ENCOUNTER — Other Ambulatory Visit: Payer: Self-pay

## 2021-12-19 ENCOUNTER — Ambulatory Visit (HOSPITAL_COMMUNITY)
Admission: RE | Admit: 2021-12-19 | Discharge: 2021-12-19 | Disposition: A | Discharge status: Home / Self Care | Payer: 59 | Source: Ambulatory Visit | Attending: Orthopedic Surgery | Admitting: Orthopedic Surgery

## 2021-12-19 ENCOUNTER — Encounter (HOSPITAL_COMMUNITY)
Admission: RE | Admit: 2021-12-19 | Discharge: 2021-12-19 | Disposition: A | Discharge status: Home / Self Care | Payer: 59 | Source: Ambulatory Visit | Attending: Orthopedic Surgery | Admitting: Orthopedic Surgery

## 2021-12-19 ENCOUNTER — Encounter (HOSPITAL_COMMUNITY): Payer: Self-pay

## 2021-12-19 VITALS — BP 139/83 | HR 65 | Temp 98.1°F | Resp 18 | Ht 64.0 in | Wt 139.0 lb

## 2021-12-19 DIAGNOSIS — F1910 Other psychoactive substance abuse, uncomplicated: Secondary | ICD-10-CM

## 2021-12-19 DIAGNOSIS — Z01818 Encounter for other preprocedural examination: Secondary | ICD-10-CM | POA: Diagnosis not present

## 2021-12-19 DIAGNOSIS — M47814 Spondylosis without myelopathy or radiculopathy, thoracic region: Secondary | ICD-10-CM | POA: Diagnosis not present

## 2021-12-19 HISTORY — DX: Personal history of other diseases of the digestive system: Z87.19

## 2021-12-19 HISTORY — DX: Unspecified osteoarthritis, unspecified site: M19.90

## 2021-12-19 HISTORY — DX: Gastro-esophageal reflux disease without esophagitis: K21.9

## 2021-12-19 HISTORY — DX: Chronic obstructive pulmonary disease, unspecified: J44.9

## 2021-12-19 LAB — CBC
HCT: 39.1 % (ref 36.0–46.0)
Hemoglobin: 13.1 g/dL (ref 12.0–15.0)
MCH: 32.2 pg (ref 26.0–34.0)
MCHC: 33.5 g/dL (ref 30.0–36.0)
MCV: 96.1 fL (ref 80.0–100.0)
Platelets: 279 10*3/uL (ref 150–400)
RBC: 4.07 MIL/uL (ref 3.87–5.11)
RDW: 13.1 % (ref 11.5–15.5)
WBC: 6.6 10*3/uL (ref 4.0–10.5)
nRBC: 0 % (ref 0.0–0.2)

## 2021-12-19 LAB — TYPE AND SCREEN
ABO/RH(D): A POS
Antibody Screen: NEGATIVE

## 2021-12-19 LAB — COMPREHENSIVE METABOLIC PANEL
ALT: 17 U/L (ref 0–44)
AST: 21 U/L (ref 15–41)
Albumin: 3.9 g/dL (ref 3.5–5.0)
Alkaline Phosphatase: 67 U/L (ref 38–126)
Anion gap: 4 — ABNORMAL LOW (ref 5–15)
BUN: 14 mg/dL (ref 8–23)
CO2: 26 mmol/L (ref 22–32)
Calcium: 9.2 mg/dL (ref 8.9–10.3)
Chloride: 107 mmol/L (ref 98–111)
Creatinine, Ser: 0.6 mg/dL (ref 0.44–1.00)
GFR, Estimated: >60 mL/min (ref >60)
Glucose, Bld: 83 mg/dL (ref 70–99)
Potassium: 4 mmol/L (ref 3.5–5.1)
Sodium: 137 mmol/L (ref 135–145)
Total Bilirubin: 0.3 mg/dL (ref 0.3–1.2)
Total Protein: 6.9 g/dL (ref 6.5–8.1)

## 2021-12-19 LAB — SURGICAL PCR SCREEN
MRSA, PCR: NEGATIVE
Staphylococcus aureus: POSITIVE — AB

## 2021-12-19 NOTE — Progress Notes (Signed)
Anesthesia note: ? ?Bowel prep reminder:NA ? ?PCP - Dr. Esmond Harps ?Cardiologist -Dr. Kirtland Bouchard. [place] ?Other-  ? ?Chest x-ray - no ?EKG - 12/10/21-epic ?Stress Test - 04/13/21-epic ?ECHO - no ?Cardiac Cath - NA ? ?Pacemaker/ICD device last checked:NA ? ?Sleep Study - NA ?CPAP -  ? ?Pt is pre diabetic-NA ?Fasting Blood Sugar -  ?Checks Blood Sugar _____ ? ?Blood Thinner:NA ?Blood Thinner Instructions: ?Aspirin Instructions: ?Last Dose: ? ?Anesthesia review: no ? ?Patient denies shortness of breath, fever, cough and chest pain at PAT appointment ?Pt is a smoker but has cut down to 2 a day. She has a nicotine patch. She denies SOB ? ?Patient verbalized understanding of instructions that were given to them at the PAT appointment. Patient was also instructed that they will need to review over the PAT instructions again at home before surgery. yes ?

## 2021-12-21 DIAGNOSIS — M25551 Pain in right hip: Secondary | ICD-10-CM | POA: Diagnosis not present

## 2021-12-21 DIAGNOSIS — R531 Weakness: Secondary | ICD-10-CM | POA: Diagnosis not present

## 2021-12-21 DIAGNOSIS — M1611 Unilateral primary osteoarthritis, right hip: Secondary | ICD-10-CM | POA: Diagnosis not present

## 2021-12-27 DIAGNOSIS — M1611 Unilateral primary osteoarthritis, right hip: Secondary | ICD-10-CM | POA: Diagnosis present

## 2021-12-27 NOTE — H&P (Signed)
TOTAL HIP ADMISSION H&P ? ?Patient is admitted for right total hip arthroplasty. ? ?Subjective: ? ?Chief Complaint: right hip pain ? ?HPI: Gabrielle Garza, 66 y.o. female, has a history of pain and functional disability in the right hip(s) due to arthritis and patient has failed non-surgical conservative treatments for greater than 12 weeks to include NSAID's and/or analgesics, use of assistive devices, and activity modification.  Onset of symptoms was gradual starting  several  years ago with gradually worsening course since that time.The patient noted no past surgery on the right hip(s).  Patient currently rates pain in the right hip at 10 out of 10 with activity. Patient has night pain, worsening of pain with activity and weight bearing, trendelenberg gait, pain that interfers with activities of daily living, and pain with passive range of motion. Patient has evidence of subchondral cysts, periarticular osteophytes, and joint space narrowing by imaging studies. This condition presents safety issues increasing the risk of falls.   There is no current active infection. ? ?Patient Active Problem List  ? Diagnosis Date Noted  ? Osteoarthritis of right hip 12/27/2021  ? Depression 03/30/2021  ? Substance abuse (HCC) 03/30/2021  ? Routine health maintenance 01/07/2013  ? ?Past Medical History:  ?Diagnosis Date  ? Arthritis   ? RA and Osteo  ? COPD (chronic obstructive pulmonary disease) (HCC)   ? Depression   ? GERD (gastroesophageal reflux disease)   ? History of hiatal hernia   ? Substance abuse (HCC)   ? remote  ?  ?Past Surgical History:  ?Procedure Laterality Date  ? BUNIONECTOMY Bilateral 1996  ? 1997  ? KNEE SURGERY Bilateral   ? age 66 to treat Osgood-Schlatter disease  ? PILONIDAL CYST EXCISION  2013  ? TOOTH EXTRACTION  11/08/2021  ? TUBAL LIGATION  2000  ?  ?No current facility-administered medications for this encounter.  ? ?Current Outpatient Medications  ?Medication Sig Dispense Refill Last Dose  ?  atorvastatin (LIPITOR) 20 MG tablet Take 20 mg by mouth daily.     ? b complex vitamins tablet Take 1 tablet by mouth daily.     ? buPROPion (WELLBUTRIN XL) 150 MG 24 hr tablet Take 150 mg by mouth every morning.     ? celecoxib (CELEBREX) 200 MG capsule Take 200 mg by mouth daily.     ? Cholecalciferol (VITAMIN D) 50 MCG (2000 UT) tablet Take 2,000 Units by mouth daily.     ? folic acid (FOLVITE) 1 MG tablet Take 1 mg by mouth daily.     ? methotrexate (RHEUMATREX) 2.5 MG tablet Take 20 mg by mouth every Sunday. Takes 8 - 2.5 mg tablets a week     ? NICOTINE TD Place 1 patch onto the skin daily.     ? sertraline (ZOLOFT) 100 MG tablet Take 100 mg by mouth daily.     ? varenicline (CHANTIX) 0.5 MG tablet Take 0.5 mg by mouth 2 (two) times daily.     ? ?Allergies  ?Allergen Reactions  ? Flonase [Fluticasone Propionate] Other (See Comments)  ?  Headache   ? Plaquenil [Hydroxychloroquine]   ?  Pt stated, "This made me feel dizzy"  ?  ?Social History  ? ?Tobacco Use  ? Smoking status: Every Day  ?  Packs/day: 0.25  ?  Years: 46.00  ?  Pack years: 11.50  ?  Types: Cigarettes  ? Smokeless tobacco: Never  ?Substance Use Topics  ? Alcohol use: No  ?  ?No family  history on file.  ? ?Review of Systems  ?Constitutional: Negative.   ?HENT:  Positive for sinus pressure and tinnitus.   ?Eyes:   ?     Poor vision  ?Respiratory: Negative.    ?Cardiovascular: Negative.   ?Gastrointestinal:   ?     Heartburn  ?Endocrine: Negative.   ?Genitourinary: Negative.   ?Musculoskeletal:  Positive for arthralgias.  ?Skin: Negative.   ?Allergic/Immunologic: Negative.   ?Neurological: Negative.   ?Hematological: Negative.   ?Psychiatric/Behavioral: Negative.    ? ?Objective: ? ?Physical Exam ?Constitutional:   ?   Appearance: Normal appearance. She is normal weight.  ?HENT:  ?   Head: Normocephalic and atraumatic.  ?   Nose: Nose normal.  ?   Mouth/Throat:  ?   Mouth: Mucous membranes are moist.  ?   Pharynx: Oropharynx is clear.  ?Eyes:  ?    Pupils: Pupils are equal, round, and reactive to light.  ?Cardiovascular:  ?   Pulses: Normal pulses.  ?Pulmonary:  ?   Effort: Pulmonary effort is normal.  ?Musculoskeletal:  ?   Cervical back: Normal range of motion and neck supple.  ?   Comments: Patient ambulates with a profound right-sided limp.  Any taps at internal rotation of the right hip causes severe pain she get to maybe 5? external rotation is to 30? also with significant pain.  Skin is intact neurovascular intact distally.  Toes are pink and well perfused.  Both knees have 5? flexion contractures and flex 125?Marland Kitchen  ?Skin: ?   General: Skin is warm and dry.  ?Neurological:  ?   General: No focal deficit present.  ?   Mental Status: She is alert and oriented to person, place, and time. Mental status is at baseline.  ?Psychiatric:     ?   Mood and Affect: Mood normal.     ?   Behavior: Behavior normal.     ?   Thought Content: Thought content normal.     ?   Judgment: Judgment normal.  ? ? ?Vital signs in last 24 hours: ?  ? ?Labs: ? ? ?Estimated body mass index is 23.86 kg/m? as calculated from the following: ?  Height as of 12/19/21: 5\' 4"  (1.626 m). ?  Weight as of 12/19/21: 63 kg. ? ? ?Imaging Review ?Plain radiographs demonstrate  AP pelvis and crosstable lateral shows subchondral cysts in both the right femoral head and acetabulum lateral subluxation of the femoral head 7 mm peripheral osteophytes and complete loss of articular cartilage superiorly and medially. ? ? ?Assessment/Plan: ? ?End stage arthritis, right hip(s) ? ?The patient history, physical examination, clinical judgement of the provider and imaging studies are consistent with end stage degenerative joint disease of the right hip(s) and total hip arthroplasty is deemed medically necessary. The treatment options including medical management, injection therapy, arthroscopy and arthroplasty were discussed at length. The risks and benefits of total hip arthroplasty were presented and reviewed.  The risks due to aseptic loosening, infection, stiffness, dislocation/subluxation,  thromboembolic complications and other imponderables were discussed.  The patient acknowledged the explanation, agreed to proceed with the plan and consent was signed. Patient is being admitted for inpatient treatment for surgery, pain control, PT, OT, prophylactic antibiotics, VTE prophylaxis, progressive ambulation and ADL's and discharge planning.The patient is planning to be discharged home with home health services ? ? ? ?Patient's anticipated LOS is less than 2 midnights, meeting these requirements: ?- Younger than 65 ?- Lives within 1 hour of care ?-  Has a competent adult at home to recover with post-op recover ?- NO history of ? - Chronic pain requiring opiods ? - Diabetes ? - Coronary Artery Disease ? - Heart failure ? - Heart attack ? - Stroke ? - DVT/VTE ? - Cardiac arrhythmia ? - Respiratory Failure/COPD ? - Renal failure ? - Anemia ? - Advanced Liver disease ? ? ?

## 2022-01-01 ENCOUNTER — Encounter (HOSPITAL_COMMUNITY): Payer: Self-pay | Admitting: Orthopedic Surgery

## 2022-01-01 ENCOUNTER — Encounter (HOSPITAL_COMMUNITY)
Admission: RE | Disposition: A | Discharge status: Home / Self Care | Payer: Self-pay | Source: Home / Self Care | Attending: Orthopedic Surgery

## 2022-01-01 ENCOUNTER — Ambulatory Visit (HOSPITAL_BASED_OUTPATIENT_CLINIC_OR_DEPARTMENT_OTHER): Payer: 59 | Admitting: Anesthesiology

## 2022-01-01 ENCOUNTER — Other Ambulatory Visit: Payer: Self-pay

## 2022-01-01 ENCOUNTER — Ambulatory Visit (HOSPITAL_COMMUNITY): Payer: 59

## 2022-01-01 ENCOUNTER — Ambulatory Visit (HOSPITAL_COMMUNITY)
Admission: RE | Admit: 2022-01-01 | Discharge: 2022-01-01 | Disposition: A | Discharge status: Home / Self Care | Payer: 59 | Attending: Orthopedic Surgery | Admitting: Orthopedic Surgery

## 2022-01-01 ENCOUNTER — Ambulatory Visit (HOSPITAL_COMMUNITY): Payer: 59 | Admitting: Physician Assistant

## 2022-01-01 DIAGNOSIS — F17210 Nicotine dependence, cigarettes, uncomplicated: Secondary | ICD-10-CM | POA: Insufficient documentation

## 2022-01-01 DIAGNOSIS — M1990 Unspecified osteoarthritis, unspecified site: Secondary | ICD-10-CM | POA: Diagnosis not present

## 2022-01-01 DIAGNOSIS — M1611 Unilateral primary osteoarthritis, right hip: Secondary | ICD-10-CM | POA: Diagnosis not present

## 2022-01-01 DIAGNOSIS — K449 Diaphragmatic hernia without obstruction or gangrene: Secondary | ICD-10-CM | POA: Diagnosis not present

## 2022-01-01 DIAGNOSIS — R262 Difficulty in walking, not elsewhere classified: Secondary | ICD-10-CM | POA: Insufficient documentation

## 2022-01-01 DIAGNOSIS — J449 Chronic obstructive pulmonary disease, unspecified: Secondary | ICD-10-CM | POA: Insufficient documentation

## 2022-01-01 DIAGNOSIS — Z96641 Presence of right artificial hip joint: Secondary | ICD-10-CM | POA: Diagnosis not present

## 2022-01-01 DIAGNOSIS — K219 Gastro-esophageal reflux disease without esophagitis: Secondary | ICD-10-CM | POA: Diagnosis not present

## 2022-01-01 DIAGNOSIS — Z8719 Personal history of other diseases of the digestive system: Secondary | ICD-10-CM | POA: Insufficient documentation

## 2022-01-01 DIAGNOSIS — F32A Depression, unspecified: Secondary | ICD-10-CM | POA: Insufficient documentation

## 2022-01-01 DIAGNOSIS — R69 Illness, unspecified: Secondary | ICD-10-CM | POA: Diagnosis not present

## 2022-01-01 DIAGNOSIS — M25751 Osteophyte, right hip: Secondary | ICD-10-CM | POA: Diagnosis not present

## 2022-01-01 DIAGNOSIS — Z471 Aftercare following joint replacement surgery: Secondary | ICD-10-CM | POA: Diagnosis not present

## 2022-01-01 HISTORY — PX: TOTAL HIP ARTHROPLASTY: SHX124

## 2022-01-01 LAB — ABO/RH: ABO/RH(D): A POS

## 2022-01-01 SURGERY — ARTHROPLASTY, HIP, TOTAL, ANTERIOR APPROACH
Anesthesia: General | Site: Hip | Laterality: Right

## 2022-01-01 MED ORDER — TRANEXAMIC ACID-NACL 1000-0.7 MG/100ML-% IV SOLN: 1000.0000 mg | Freq: Once | INTRAVENOUS | Status: DC

## 2022-01-01 MED ORDER — TRANEXAMIC ACID 1000 MG/10ML IV SOLN
2000.0000 mg | INTRAVENOUS | Status: DC
  Filled 2022-01-01: qty 20

## 2022-01-01 MED ORDER — LACTATED RINGERS IV SOLN
INTRAVENOUS | Status: DC
Start: 2022-01-01 — End: 2022-01-01

## 2022-01-01 MED ORDER — 0.9 % SODIUM CHLORIDE (POUR BTL) OPTIME
TOPICAL | Status: DC | PRN
  Administered 2022-01-01: 1000 mL

## 2022-01-01 MED ORDER — ACETAMINOPHEN 325 MG PO TABS: 325.0000 mg | ORAL_TABLET | ORAL | Status: DC | PRN

## 2022-01-01 MED ORDER — FENTANYL CITRATE PF 50 MCG/ML IJ SOSY
PREFILLED_SYRINGE | INTRAMUSCULAR | Status: AC
  Filled 2022-01-01: qty 1

## 2022-01-01 MED ORDER — FENTANYL CITRATE PF 50 MCG/ML IJ SOSY
25.0000 ug | PREFILLED_SYRINGE | INTRAMUSCULAR | Status: DC | PRN
  Administered 2022-01-01: 50 ug via INTRAVENOUS

## 2022-01-01 MED ORDER — MIDAZOLAM HCL 2 MG/2ML IJ SOLN
INTRAMUSCULAR | Status: AC
  Filled 2022-01-01: qty 2

## 2022-01-01 MED ORDER — OXYCODONE HCL 5 MG PO TABS
5.0000 mg | ORAL_TABLET | Freq: Once | ORAL | Status: AC | PRN
  Administered 2022-01-01: 5 mg via ORAL

## 2022-01-01 MED ORDER — ONDANSETRON HCL 4 MG/2ML IJ SOLN
4.0000 mg | Freq: Once | INTRAMUSCULAR | Status: AC | PRN
  Administered 2022-01-01: 4 mg via INTRAVENOUS

## 2022-01-01 MED ORDER — ASPIRIN EC 81 MG PO TBEC: 81.0000 mg | DELAYED_RELEASE_TABLET | Freq: Every day | ORAL | 2 refills | Status: AC

## 2022-01-01 MED ORDER — PHENYLEPHRINE 40 MCG/ML (10ML) SYRINGE FOR IV PUSH (FOR BLOOD PRESSURE SUPPORT)
PREFILLED_SYRINGE | INTRAVENOUS | Status: DC | PRN
Start: 2022-01-01 — End: 2022-01-01
  Administered 2022-01-01: 120 ug via INTRAVENOUS
  Administered 2022-01-01: 80 ug via INTRAVENOUS
  Administered 2022-01-01: 120 ug via INTRAVENOUS
  Administered 2022-01-01: 80 ug via INTRAVENOUS

## 2022-01-01 MED ORDER — PROPOFOL 500 MG/50ML IV EMUL
INTRAVENOUS | Status: AC
  Filled 2022-01-01: qty 50

## 2022-01-01 MED ORDER — FENTANYL CITRATE (PF) 100 MCG/2ML IJ SOLN
INTRAMUSCULAR | Status: AC
  Filled 2022-01-01: qty 2

## 2022-01-01 MED ORDER — TRANEXAMIC ACID 1000 MG/10ML IV SOLN
INTRAVENOUS | Status: DC | PRN
  Administered 2022-01-01: 2000 mg via TOPICAL

## 2022-01-01 MED ORDER — OXYCODONE HCL 5 MG PO TABS
ORAL_TABLET | ORAL | Status: AC
  Filled 2022-01-01: qty 1

## 2022-01-01 MED ORDER — OXYCODONE HCL 5 MG/5ML PO SOLN: 5.0000 mg | Freq: Once | ORAL | Status: AC | PRN

## 2022-01-01 MED ORDER — LACTATED RINGERS IV BOLUS
250.0000 mL | Freq: Once | INTRAVENOUS | Status: AC
  Administered 2022-01-01: 250 mL via INTRAVENOUS

## 2022-01-01 MED ORDER — TRAMADOL HCL 50 MG PO TABS
ORAL_TABLET | ORAL | Status: AC
  Filled 2022-01-01: qty 1

## 2022-01-01 MED ORDER — PROPOFOL 500 MG/50ML IV EMUL
INTRAVENOUS | Status: DC | PRN
  Administered 2022-01-01: 75 ug/kg/min via INTRAVENOUS

## 2022-01-01 MED ORDER — BUPIVACAINE IN DEXTROSE 0.75-8.25 % IT SOLN
INTRATHECAL | Status: DC | PRN
  Administered 2022-01-01: 1.6 mL via INTRATHECAL

## 2022-01-01 MED ORDER — PROPOFOL 10 MG/ML IV BOLUS
INTRAVENOUS | Status: AC
  Filled 2022-01-01: qty 20

## 2022-01-01 MED ORDER — TRANEXAMIC ACID-NACL 1000-0.7 MG/100ML-% IV SOLN
1000.0000 mg | INTRAVENOUS | Status: AC
  Administered 2022-01-01: 1000 mg via INTRAVENOUS
  Filled 2022-01-01: qty 100

## 2022-01-01 MED ORDER — ONDANSETRON HCL 4 MG/2ML IJ SOLN
INTRAMUSCULAR | Status: AC
  Filled 2022-01-01: qty 2

## 2022-01-01 MED ORDER — BUPIVACAINE LIPOSOME 1.3 % IJ SUSP
INTRAMUSCULAR | Status: AC
  Filled 2022-01-01: qty 10

## 2022-01-01 MED ORDER — BUPIVACAINE-EPINEPHRINE (PF) 0.25% -1:200000 IJ SOLN
INTRAMUSCULAR | Status: AC
  Filled 2022-01-01: qty 30

## 2022-01-01 MED ORDER — BUPIVACAINE LIPOSOME 1.3 % IJ SUSP: 10.0000 mL | Freq: Once | INTRAMUSCULAR | Status: DC

## 2022-01-01 MED ORDER — CEFAZOLIN SODIUM-DEXTROSE 2-4 GM/100ML-% IV SOLN
2.0000 g | INTRAVENOUS | Status: AC
  Administered 2022-01-01: 2 g via INTRAVENOUS
  Filled 2022-01-01: qty 100

## 2022-01-01 MED ORDER — ACETAMINOPHEN 160 MG/5ML PO SOLN: 325.0000 mg | ORAL | Status: DC | PRN

## 2022-01-01 MED ORDER — TRAMADOL HCL 50 MG PO TABS
50.0000 mg | ORAL_TABLET | Freq: Four times a day (QID) | ORAL | Status: DC | PRN
Start: 2022-01-01 — End: 2022-01-01
  Administered 2022-01-01: 50 mg via ORAL

## 2022-01-01 MED ORDER — SODIUM CHLORIDE (PF) 0.9 % IJ SOLN
INTRAMUSCULAR | Status: AC
  Filled 2022-01-01: qty 50

## 2022-01-01 MED ORDER — MIDAZOLAM HCL 2 MG/2ML IJ SOLN
INTRAMUSCULAR | Status: DC | PRN
  Administered 2022-01-01 (×2): 1 mg via INTRAVENOUS

## 2022-01-01 MED ORDER — SODIUM CHLORIDE 0.9 % IV SOLN
INTRAVENOUS | Status: DC | PRN
  Administered 2022-01-01: 60 mL

## 2022-01-01 MED ORDER — LACTATED RINGERS IV BOLUS
500.0000 mL | Freq: Once | INTRAVENOUS | Status: AC
  Administered 2022-01-01: 500 mL via INTRAVENOUS

## 2022-01-01 MED ORDER — STERILE WATER FOR IRRIGATION IR SOLN
Status: DC | PRN
  Administered 2022-01-01: 2000 mL

## 2022-01-01 MED ORDER — CHLORHEXIDINE GLUCONATE 0.12 % MT SOLN: 15.0000 mL | Freq: Once | OROMUCOSAL | Status: AC

## 2022-01-01 MED ORDER — ORAL CARE MOUTH RINSE
15.0000 mL | Freq: Once | OROMUCOSAL | Status: AC
  Administered 2022-01-01: 15 mL via OROMUCOSAL

## 2022-01-01 MED ORDER — BUPIVACAINE-EPINEPHRINE 0.25% -1:200000 IJ SOLN
INTRAMUSCULAR | Status: DC | PRN
  Administered 2022-01-01: 30 mL

## 2022-01-01 MED ORDER — LACTATED RINGERS IV SOLN: INTRAVENOUS | Status: DC

## 2022-01-01 MED ORDER — FENTANYL CITRATE (PF) 100 MCG/2ML IJ SOLN
INTRAMUSCULAR | Status: DC | PRN
  Administered 2022-01-01 (×2): 50 ug via INTRAVENOUS

## 2022-01-01 MED ORDER — PROPOFOL 10 MG/ML IV BOLUS
INTRAVENOUS | Status: DC | PRN
  Administered 2022-01-01: 30 mg via INTRAVENOUS

## 2022-01-01 MED ORDER — POVIDONE-IODINE 10 % EX SWAB
2.0000 "application " | Freq: Once | CUTANEOUS | Status: AC
  Administered 2022-01-01: 2 via TOPICAL

## 2022-01-01 MED ORDER — OXYCODONE HCL 5 MG PO TABS: 5.0000 mg | ORAL_TABLET | ORAL | 0 refills | Status: AC | PRN

## 2022-01-01 MED ORDER — TIZANIDINE HCL 2 MG PO CAPS: 4.0000 mg | ORAL_CAPSULE | Freq: Three times a day (TID) | ORAL | 0 refills | Status: AC | PRN

## 2022-01-01 MED ORDER — MEPERIDINE HCL 50 MG/ML IJ SOLN: 6.2500 mg | INTRAMUSCULAR | Status: DC | PRN

## 2022-01-01 SURGICAL SUPPLY — 46 items
BAG COUNTER SPONGE SURGICOUNT (BAG) ×3 IMPLANT
BAG DECANTER FOR FLEXI CONT (MISCELLANEOUS) ×6 IMPLANT
BAG SPNG CNTER NS LX DISP (BAG) ×1
BLADE SAW SGTL 18X1.27X75 (BLADE) ×3 IMPLANT
COVER PERINEAL POST (MISCELLANEOUS) ×3 IMPLANT
COVER SURGICAL LIGHT HANDLE (MISCELLANEOUS) ×3 IMPLANT
CUP ACETBLR 52 OD 100 SERIES (Hips) ×1 IMPLANT
DRAPE STERI IOBAN 125X83 (DRAPES) ×3 IMPLANT
DRAPE U-SHAPE 47X51 STRL (DRAPES) ×6 IMPLANT
DRESSING AQUACEL AG SP 3.5X10 (GAUZE/BANDAGES/DRESSINGS) IMPLANT
DRSG AQUACEL AG ADV 3.5X10 (GAUZE/BANDAGES/DRESSINGS) ×3 IMPLANT
DRSG AQUACEL AG SP 3.5X10 (GAUZE/BANDAGES/DRESSINGS) ×2
DURAPREP 26ML APPLICATOR (WOUND CARE) ×3 IMPLANT
ELECT BLADE TIP CTD 4 INCH (ELECTRODE) ×3 IMPLANT
ELECT REM PT RETURN 15FT ADLT (MISCELLANEOUS) ×3 IMPLANT
ELIMINATOR HOLE APEX DEPUY (Hips) ×1 IMPLANT
GLOVE BIO SURGEON STRL SZ7.5 (GLOVE) ×3 IMPLANT
GLOVE BIO SURGEON STRL SZ8.5 (GLOVE) ×3 IMPLANT
GLOVE BIOGEL PI IND STRL 8 (GLOVE) ×2 IMPLANT
GLOVE BIOGEL PI IND STRL 9 (GLOVE) ×2 IMPLANT
GLOVE BIOGEL PI INDICATOR 8 (GLOVE) ×1
GLOVE BIOGEL PI INDICATOR 9 (GLOVE) ×1
GOWN STRL REUS W/ TWL XL LVL3 (GOWN DISPOSABLE) ×4 IMPLANT
GOWN STRL REUS W/TWL XL LVL3 (GOWN DISPOSABLE) ×4
HEAD CERAMIC DELTA 36 PLUS 1.5 (Hips) ×1 IMPLANT
HOLDER FOLEY CATH W/STRAP (MISCELLANEOUS) ×3 IMPLANT
KIT TURNOVER KIT A (KITS) IMPLANT
LINER NEUTRAL 52X36MM PLUS 4 (Liner) ×1 IMPLANT
MANIFOLD NEPTUNE II (INSTRUMENTS) ×3 IMPLANT
NDL HYPO 21X1.5 SAFETY (NEEDLE) ×4 IMPLANT
NEEDLE HYPO 21X1.5 SAFETY (NEEDLE) ×4 IMPLANT
NS IRRIG 1000ML POUR BTL (IV SOLUTION) ×3 IMPLANT
PACK ANTERIOR HIP CUSTOM (KITS) ×3 IMPLANT
SPIKE FLUID TRANSFER (MISCELLANEOUS) ×3 IMPLANT
STEM FEMORAL SZ9 STD ACTIS (Stem) ×1 IMPLANT
SUT ETHIBOND NAB CT1 #1 30IN (SUTURE) ×3 IMPLANT
SUT VIC AB 0 CT1 27 (SUTURE)
SUT VIC AB 0 CT1 27XBRD ANBCTR (SUTURE) IMPLANT
SUT VIC AB 1 CTX 36 (SUTURE) ×2
SUT VIC AB 1 CTX36XBRD ANBCTR (SUTURE) ×2 IMPLANT
SUT VIC AB 2-0 CT1 27 (SUTURE) ×2
SUT VIC AB 2-0 CT1 TAPERPNT 27 (SUTURE) ×2 IMPLANT
SUT VIC AB 3-0 CT1 27 (SUTURE) ×2
SUT VIC AB 3-0 CT1 TAPERPNT 27 (SUTURE) ×2 IMPLANT
SYR CONTROL 10ML LL (SYRINGE) ×9 IMPLANT
TRAY FOLEY MTR SLVR 16FR STAT (SET/KITS/TRAYS/PACK) IMPLANT

## 2022-01-01 NOTE — Anesthesia Preprocedure Evaluation (Addendum)
Anesthesia Evaluation  ?Patient identified by MRN, date of birth, ID band ?Patient awake ? ? ? ?Reviewed: ?Allergy & Precautions, H&P , NPO status , Patient's Chart, lab work & pertinent test results, reviewed documented beta blocker date and time  ? ?Airway ?Mallampati: II ? ?TM Distance: >3 FB ?Neck ROM: full ? ? ? Dental ?no notable dental hx. ? ?  ?Pulmonary ?COPD, Current Smoker,  ?  ?Pulmonary exam normal ?breath sounds clear to auscultation ? ? ? ? ? ? Cardiovascular ?Exercise Tolerance: Good ? ?Rhythm:regular Rate:Normal ? ?? LExiscan stress is electrically nondiagnostic ?? Myoview scan shows probable normal perfusion and minimal soft tissue  ?attenuation. No evidence for sgnificant ischemia or scar ?? This is a low risk study. ?  ?Neuro/Psych ?PSYCHIATRIC DISORDERS Depression negative neurological ROS ?   ? GI/Hepatic ?hiatal hernia, GERD  ,(+)  ?  ? substance abuse ? ,   ?Endo/Other  ?negative endocrine ROS ? Renal/GU ?negative Renal ROS  ?negative genitourinary ?  ?Musculoskeletal ? ?(+) Arthritis , Osteoarthritis,   ? Abdominal ?  ?Peds ? Hematology ?negative hematology ROS ?(+)   ?Anesthesia Other Findings ? ? Reproductive/Obstetrics ?negative OB ROS ? ?  ? ? ? ? ? ? ? ? ? ? ? ? ? ?  ?  ? ? ? ? ? ? ? ?Anesthesia Physical ?Anesthesia Plan ? ?ASA: 3 ? ?Anesthesia Plan: MAC and Spinal  ? ?Post-op Pain Management: Minimal or no pain anticipated  ? ?Induction:  ? ?PONV Risk Score and Plan: 2 and Propofol infusion ? ?Airway Management Planned: Natural Airway, Simple Face Mask and Nasal Cannula ? ?Additional Equipment: None ? ?Intra-op Plan:  ? ?Post-operative Plan:  ? ?Informed Consent: I have reviewed the patients History and Physical, chart, labs and discussed the procedure including the risks, benefits and alternatives for the proposed anesthesia with the patient or authorized representative who has indicated his/her understanding and acceptance.  ? ? ? ?Dental Advisory  Given ? ?Plan Discussed with: CRNA and Anesthesiologist ? ?Anesthesia Plan Comments:   ? ? ? ? ? ?Anesthesia Quick Evaluation ? ?

## 2022-01-01 NOTE — Anesthesia Postprocedure Evaluation (Signed)
Anesthesia Post Note ? ?Patient: Gabrielle Garza ? ?Procedure(s) Performed: RIGHT TOTAL HIP ARTHROPLASTY ANTERIOR APPROACH (Right: Hip) ? ?  ? ?Patient location during evaluation: PACU ?Anesthesia Type: General ?Level of consciousness: awake and alert ?Pain management: pain level controlled ?Vital Signs Assessment: post-procedure vital signs reviewed and stable ?Respiratory status: spontaneous breathing, nonlabored ventilation, respiratory function stable and patient connected to nasal cannula oxygen ?Cardiovascular status: blood pressure returned to baseline and stable ?Postop Assessment: no apparent nausea or vomiting ?Anesthetic complications: no ? ? ?No notable events documented. ? ?Last Vitals:  ?Vitals:  ? 01/01/22 1400 01/01/22 1415  ?BP: 92/70 105/72  ?Pulse: 65 72  ?Resp: 18 18  ?Temp:    ?SpO2: 99% 100%  ?  ?Last Pain:  ?Vitals:  ? 01/01/22 1415  ?TempSrc:   ?PainSc: 0-No pain  ? ? ?  ?  ?  ?  ?  ?  ? ?[name] ? ? ? ? ?

## 2022-01-01 NOTE — Anesthesia Procedure Notes (Signed)
Spinal ? ?Patient location during procedure: OR ?Start time: 01/01/2022 9:58 AM ?End time: 01/01/2022 10:02 AM ?Reason for block: surgical anesthesia ?Staffing ?Performed: resident/CRNA  ?Resident/CRNA: Lollie Sails, CRNA ?Preanesthetic Checklist ?Completed: patient identified, IV checked, site marked, risks and benefits discussed, surgical consent, monitors and equipment checked, pre-op evaluation and timeout performed ?Spinal Block ?Patient position: sitting ?Prep: DuraPrep and site prepped and draped ?Patient monitoring: heart rate, continuous pulse ox, blood pressure and cardiac monitor ?Approach: midline ?Location: L3-4 ?Injection technique: single-shot ?Needle ?Needle type: Pencan  ?Needle gauge: 24 G ?Needle length: 10 cm ?Assessment ?Events: CSF return ?Additional Notes ?Expiration date on kit noted and within range.  Sterile prep and drape.    Local with Lidocaine 1%.  Good CSF flow noted with no heme or c/o paresthesia.   Patient tolerated well.   ? ? ? ?

## 2022-01-01 NOTE — Interval H&P Note (Signed)
History and Physical Interval Note: ? ?01/01/2022 ?9:31 AM ? ?Gabrielle Garza  has presented today for surgery, with the diagnosis of RIGHT HIP OSTEOARTHRITIS.  The various methods of treatment have been discussed with the patient and family. After consideration of risks, benefits and other options for treatment, the patient has consented to  Procedure(s): ?RIGHT TOTAL HIP ARTHROPLASTY ANTERIOR APPROACH (Right) as a surgical intervention.  The patient's history has been reviewed, patient examined, no change in status, stable for surgery.  I have reviewed the patient's chart and labs.  Questions were answered to the patient's satisfaction.   ? ? ?Nestor Lewandowsky ? ? ?

## 2022-01-01 NOTE — Evaluation (Signed)
Physical Therapy Evaluation Patient Details Name: Gabrielle Garza MRN: [id number] DOB: April 16, 1956 Today's Date: 01/01/2022  History of Present Illness  Pt is a 66yo female presenting s/p R-THA, anterior approach on 01/01/22. OA, COPD, depression, GERD, bilateral knee surgery for Osgood-Schlatter  Clinical Impression  Gabrielle Garza is a 66 y.o. female POD 0 s/p the procedure above. Patient reports independence with mobility at baseline. Patient is now limited by functional impairments (see PT problem list below) and requires min guard for transfers and gait with RW. Patient was able to ambulate 80 feet with RW and min guard assist and cues for safe walker management. Patient educated on safe sequencing for stair mobility and verbalized safe guarding position for people assisting with mobility. Patient instructed in exercises to facilitate ROM and circulation and appropriate progression of exercises, including walking program. Patient will benefit from continued skilled PT interventions to address impairments and progress towards PLOF. Patient has met mobility goals at adequate level for discharge home; will continue to follow if pt continues acute stay to progress towards Mod I goals.        Recommendations for follow up therapy are one component of a multi-disciplinary discharge planning process, led by the attending physician.  Recommendations may be updated based on patient status, additional functional criteria and insurance authorization.  Follow Up Recommendations Follow physician's recommendations for discharge plan and follow up therapies    Assistance Recommended at Discharge Set up Supervision/Assistance  Patient can return home with the following  A little help with bathing/dressing/bathroom;A little help with walking and/or transfers;Assistance with cooking/housework;Assist for transportation;Help with stairs or ramp for entrance    Equipment Recommendations Rolling walker (2  wheels)  Recommendations for Other Services       Functional Status Assessment Patient has had a recent decline in their functional status and demonstrates the ability to make significant improvements in function in a reasonable and predictable amount of time.     Precautions / Restrictions Precautions Precautions: None Restrictions Weight Bearing Restrictions: No      Mobility  Bed Mobility Overal bed mobility: Needs Assistance Bed Mobility: Supine to Sit     Supine to sit: Min guard     General bed mobility comments: pt min guard for safety only, no physical assist required.    Transfers Overall transfer level: Needs assistance Equipment used: Rolling walker (2 wheels) Transfers: Sit to/from Stand Sit to Stand: Min guard           General transfer comment: Pt min guard for safety only, no physical assist required. Slightly impulsive and tried to stand before directed but responded well to cuing and requests to wait.    Ambulation/Gait Ambulation/Gait assistance: Min guard Gait Distance (Feet): 80 Feet Assistive device: Rolling walker (2 wheels) Gait Pattern/deviations: Step-to pattern, Step-through pattern, Decreased stance time - right, Decreased step length - right Gait velocity: decreased     General Gait Details: Pt ambulated with RW and min guard assist for 80 feet, no physical assist required or overt LOB noted. Demonstrated step-to pattern progressing to step through with VCs for sequencing, safe walker management, and slowing down.  Stairs Stairs: Yes Stairs assistance: Min guard, Supervision Stair Management: No rails, Step to pattern, Backwards, With walker Number of Stairs: 2 General stair comments: Pt and caregiver educated on safe stair management with RW, verbalized understanding. Supervised caregiver gaurding pt with VCs for hand placement and guading location. Pt demonstrated safe stair management, guarding was excellent. Educated about  appropriate  time to introduce pets upon return home.  Wheelchair Mobility    Modified Rankin (Stroke Patients Only)       Balance Overall balance assessment: Needs assistance Sitting-balance support: Feet supported, No upper extremity supported Sitting balance-Leahy Scale: Fair     Standing balance support: Reliant on assistive device for balance, During functional activity, Bilateral upper extremity supported Standing balance-Leahy Scale: Poor                               Pertinent Vitals/Pain Pain Assessment Pain Assessment: 0-10 Pain Score: 8  Pain Location: r hip Pain Descriptors / Indicators: Operative site guarding, Grimacing Pain Intervention(s): Limited activity within patient's tolerance, Monitored during session, Repositioned    Home Living Family/patient expects to be discharged to:: Private residence Living Arrangements: Children (Daughter Gabrielle Garza) Available Help at Discharge: Family;Available 24 hours/day Gabrielle Garza will be there for two days, then [place] on the weekend) Type of Home: House Home Access: Stairs to enter   [employer] of Steps: 2   Home Layout: One level Home Equipment: Toilet riser;Shower seat;BSC/3in1;Cane - single point      Prior Function Prior Level of Function : Independent/Modified Independent             Mobility Comments: SPC for longer distances or in crowded areas ADLs Comments: ind     Hand Dominance        Extremity/Trunk Assessment   Upper Extremity Assessment Upper Extremity Assessment: Overall WFL for tasks assessed    Lower Extremity Assessment Lower Extremity Assessment: RLE deficits/detail;LLE deficits/detail RLE Deficits / Details: MMT: ank df/pf 5/5 RLE Sensation: WNL LLE Deficits / Details: MMT: ank df/pf 5/5 LLE Sensation: WNL    Cervical / Trunk Assessment Cervical / Trunk Assessment: Normal  Communication   Communication: No difficulties  Cognition Arousal/Alertness:  Awake/alert Behavior During Therapy: WFL for tasks assessed/performed Overall Cognitive Status: Within Functional Limits for tasks assessed                                          General Comments      Exercises Total Joint Exercises Ankle Circles/Pumps: AROM, Both, 20 reps Quad Sets: AROM, Right, Other reps (comment) (2) Short Arc Quad: AROM, Other reps (comment), Right (3) Heel Slides: AROM, Right, Other reps (comment) (3) Hip ABduction/ADduction: AROM, Right, Other reps (comment) (2) Straight Leg Raises: AROM, Right, Other reps (comment) (2) Long Arc Quad: Other (comment) (educated, not performed.) Marching in Standing:  (educated, not performed.) Standing Hip Extension:  (educated, not performed.)   Assessment/Plan    PT Assessment Patient needs continued PT services  PT Problem List Decreased strength;Decreased range of motion;Decreased activity tolerance;Decreased balance;Decreased mobility;Decreased coordination;Decreased knowledge of use of DME;Decreased safety awareness;Pain       PT Treatment Interventions DME instruction;Gait training;Stair training;Functional mobility training;Therapeutic activities;Therapeutic exercise;Balance training;Neuromuscular re-education;Patient/family education    PT Goals (Current goals can be found in the Care Plan section)  Acute Rehab PT Goals Patient Stated Goal: To hike again PT Goal Formulation: With patient Time For Goal Achievement: 01/08/22 Potential to Achieve Goals: Good    Frequency 7X/week     Co-evaluation               AM-PAC PT "6 Clicks" Mobility  Outcome Measure Help needed turning from your back to your side while in a flat  bed without using bedrails?: A Little Help needed moving from lying on your back to sitting on the side of a flat bed without using bedrails?: A Little Help needed moving to and from a bed to a chair (including a wheelchair)?: A Little Help needed standing up from a  chair using your arms (e.g., wheelchair or bedside chair)?: A Little Help needed to walk in hospital room?: A Little Help needed climbing 3-5 steps with a railing? : A Little 6 Click Score: 18    End of Session Equipment Utilized During Treatment: Gait belt Activity Tolerance: Patient tolerated treatment well Patient left: in chair;with call bell/phone within reach;with family/visitor present Nurse Communication: Mobility status PT Visit Diagnosis: Pain;Difficulty in walking, not elsewhere classified (R26.2) Pain - Right/Left: Right Pain - part of body: Hip    Time: 1308-6578 PT Time Calculation (min) (ACUTE ONLY): 44 min   Charges:   PT Evaluation $PT Eval Low Complexity: 1 Low PT Treatments $Gait Training: 8-22 mins $Therapeutic Exercise: 8-22 mins        Jamesetta Geralds, PT, DPT [facility] Department Office: [phone] Pager: [phone]  Jamesetta Geralds 01/01/2022, 3:06 PM

## 2022-01-01 NOTE — Op Note (Addendum)
PATIENT ID:      Gabrielle Garza  ?MRN:     [id number] ?DOB/AGE:    [birth year] / 66 y.o. ? ?OPERATIVE REPORT  ? ?DATE OF PROCEDURE:  01/01/2022   ?   ?PREOPERATIVE DIAGNOSIS:  RIGHT HIP OSTEOARTHRITIS  ?                                                       ?POSTOPERATIVE DIAGNOSIS:  Same ?                                                        ?PROCEDURE: Anterior R total hip arthroplasty using a 52 mm DePuy Pinnacle  ?Cup, [employer], 0-degree polyethylene liner, a +1.5 mm x 53mm ceramic head, a 8 std Depuy Actis stem ? ?SURGEON: Nestor Lewandowsky  ?ASSISTANT:   None  ?ANESTHESIA: Spinal, Exparel 133mg  injection ?BLOOD LOSS: 300 cc ?FLUID REPLACEMENT: 1500 cc crystalloid ?TRANEXAMIC ACID: 1gm IV, 2gm Topical ?COMPLICATIONS: none  ?  ?INDICATIONS FOR PROCEDURE: A 66 y.o. year-old With  RIGHT HIP OSTEOARTHRITIS   for 2 years, x-rays show bone-on-bone arthritic changes, and osteophytes. Despite conservative measures with observation, anti-inflammatory medicine, narcotics, use of a cane, has severe unremitting pain and can ambulate only a few blocks before resting. Patient desires elective R total hip arthroplasty to decrease pain and increase function. The risks, benefits, and alternatives were discussed at length including but not limited to the risks of infection, bleeding, nerve injury, stiffness, blood clots, the need for revision surgery, cardiopulmonary complications, among others, and they were willing to proceed. Questions answered  ?   ? PROCEDURE IN DETAIL: The patient was identified by armband,   ?received preoperative IV antibiotics in the holding area at [facility]   ?Hospital, taken to the operating room , appropriate anesthetic monitors   ?were attached and anesthesia was induced with the patient on the gurney. HANA boots were applied to the feet, and the patient  was transferred to the HANA table with a peroneal post and support underneath the non-operative leg. Theoperative lower extremity was  then prepped and draped in the usual sterile fashion from just above the iliac crest to the knee. And a timeout procedure was performed. Skin along incision area was injected with 10 cc of Exparel solution. We then made a 12 cm incision along the interval at the leading edge of the tensor fascia lata of starting at 2 cm lateral to the ASIS. Small bleeders in the skin and subcutaneous tissue identified and cauterized we dissected down to the fascia and made an incision in the fascia allowing [facility] to elevate the fascia of the tensor muscle and exploited the interval between the rectus and the tensor fascia lata. A Cobra retractor was then placed along the superior neck of the femur. A cerebellar retractor was used to expose the interval between the tensor fascia lata and the rectus femoris.  We identified and cauterized the ascending branch of the anterior circumflex artery. A second Cobra retractor along the inferior neck of the femur. A small Hohmann retractor was placed underneath the origin of the rectus femoris, giving [place] good  medial exposure. Using Ronguers fatty tissue was removed from in front of the anterior capsule. The capsule was then incised, starting out at the superior anterior rim of the acetabulum going laterally along the anterior neck. The capsule was then teed along the neck superiorly and inferiorly. Electrocautery was used to release capsule from the anterior and medial neck of the femur to allow external rotation. Cobra retractors were then placed along the inferior and superior neck allowing [place] to perform a standard neck cut and removed the femoral head with a power corkscrew. We then placed a medium bent homan retractor in the cotyloid notch and posteriorly along the acetabular rim a narrow Cobra retractor. Exposed labral tissue and osteophytes were then removed. We then sequentially reamed up to a 51 mm basket reamer obtaining good coverage in all quadrants, verified by C-arm imaging. Under  C-arm control we then hammered into place a 52 mm Pinnacle cup in 45? of abduction and 15? of anteversion. The cup seated nicely and required no supplemental screws. We then placed a central hole Eliminator and a 0? polyethylene liner. The foot was then externally rotated to 130-140?Marland Kitchen The limb was extended and adducted to the floor, delivering the proximal femur up into the wound. A medium curved Hohmann retractor was placed over the greater trochanter and a long Homan retractor along the posterior femoral neck completing the exposure and lateralizing the femur. We then performed releases superiorly and and inferiorly of the capsule going back to the pirformis fossa superiorly and to the lesser trochanter inferiorly. We then entered the proximal femur with the box cutting offset chisel followed by, a canal sounder, the chili pepper and broaching up to a 8 broach. This seated nicely and we reamed the calcar. A trial reduction was performed with a 1.5 mm X 36 mm head.The limb lengths were excellent the hip was stable in 90? of external rotation. At this point the trial components removed and we hammered into place a # 8 hi  Offset Actis stem with G[employer] coating. A + 1.5 mm x 36 head was then hammered into place. The hip was reduced and final C-arm images obtained. The wound was thoroughly irrigated with normal saline solution. We repaired the ant capsule and the tensor fascia lot a with running 0 vicryl suture. the subcutaneous tissue was closed with 2-0 and 3-0 Vicryl suture followed by an Aquacil dressing. At this point the patient was awaken and transferred to hospital gurney without difficulty.  ? ?Nestor Lewandowsky ?01/01/2022, 9:32 AM ?  ?

## 2022-01-01 NOTE — Transfer of Care (Signed)
Immediate Anesthesia Transfer of Care Note ? ?Patient: Gabrielle Garza ? ?Procedure(s) Performed: RIGHT TOTAL HIP ARTHROPLASTY ANTERIOR APPROACH (Right: Hip) ? ?Patient Location: PACU ? ?Anesthesia Type:Spinal ? ?Level of Consciousness: awake, alert  and patient cooperative ? ?Airway & Oxygen Therapy: Patient Spontanous Breathing and Patient connected to face mask oxygen ? ?Post-op Assessment: Report given to RN and Post -op Vital signs reviewed and stable ? ?Post vital signs: Reviewed and stable ? ?Last Vitals:  ?Vitals Value Taken Time  ?BP 100/58 01/01/22 1140  ?Temp    ?Pulse 91 01/01/22 1142  ?Resp 19 01/01/22 1142  ?SpO2 100 % 01/01/22 1142  ?Vitals shown include unvalidated device data. ? ?Last Pain:  ?Vitals:  ? 01/01/22 0814  ?TempSrc: Oral  ?   ? ?  ? ?Complications: No notable events documented. ?

## 2022-01-01 NOTE — Anesthesia Procedure Notes (Signed)
Procedure Name: [place] ?Date/Time: 01/01/2022 9:55 AM ?Performed by: Lollie Sails, CRNA ?Pre-anesthesia Checklist: Patient identified, Emergency Drugs available, Suction available, Patient being monitored and Timeout performed ?Oxygen Delivery Method: Simple face mask ?Placement Confirmation: positive ETCO2 ? ? ? ? ?

## 2022-01-01 NOTE — Discharge Instructions (Signed)

## 2022-01-02 ENCOUNTER — Encounter (HOSPITAL_COMMUNITY): Payer: Self-pay | Admitting: Orthopedic Surgery

## 2022-01-23 DIAGNOSIS — R69 Illness, unspecified: Secondary | ICD-10-CM | POA: Diagnosis not present

## 2022-02-01 DIAGNOSIS — M0579 Rheumatoid arthritis with rheumatoid factor of multiple sites without organ or systems involvement: Secondary | ICD-10-CM | POA: Diagnosis not present

## 2022-02-01 DIAGNOSIS — M1990 Unspecified osteoarthritis, unspecified site: Secondary | ICD-10-CM | POA: Diagnosis not present

## 2022-02-01 DIAGNOSIS — M79643 Pain in unspecified hand: Secondary | ICD-10-CM | POA: Diagnosis not present

## 2022-02-01 DIAGNOSIS — M25551 Pain in right hip: Secondary | ICD-10-CM | POA: Diagnosis not present

## 2022-02-01 DIAGNOSIS — Z79899 Other long term (current) drug therapy: Secondary | ICD-10-CM | POA: Diagnosis not present

## 2022-02-01 DIAGNOSIS — K219 Gastro-esophageal reflux disease without esophagitis: Secondary | ICD-10-CM | POA: Diagnosis not present

## 2022-02-01 DIAGNOSIS — R768 Other specified abnormal immunological findings in serum: Secondary | ICD-10-CM | POA: Diagnosis not present

## 2022-02-01 DIAGNOSIS — M25511 Pain in right shoulder: Secondary | ICD-10-CM | POA: Diagnosis not present

## 2022-02-01 DIAGNOSIS — M7581 Other shoulder lesions, right shoulder: Secondary | ICD-10-CM | POA: Diagnosis not present

## 2022-02-01 DIAGNOSIS — M810 Age-related osteoporosis without current pathological fracture: Secondary | ICD-10-CM | POA: Diagnosis not present

## 2022-02-02 DIAGNOSIS — M810 Age-related osteoporosis without current pathological fracture: Secondary | ICD-10-CM | POA: Diagnosis not present

## 2022-02-02 DIAGNOSIS — H532 Diplopia: Secondary | ICD-10-CM | POA: Diagnosis not present

## 2022-02-02 DIAGNOSIS — R69 Illness, unspecified: Secondary | ICD-10-CM | POA: Diagnosis not present

## 2022-02-02 DIAGNOSIS — E559 Vitamin D deficiency, unspecified: Secondary | ICD-10-CM | POA: Diagnosis not present

## 2022-02-02 DIAGNOSIS — Z0000 Encounter for general adult medical examination without abnormal findings: Secondary | ICD-10-CM | POA: Diagnosis not present

## 2022-02-02 DIAGNOSIS — Z124 Encounter for screening for malignant neoplasm of cervix: Secondary | ICD-10-CM | POA: Diagnosis not present

## 2022-02-02 DIAGNOSIS — I2510 Atherosclerotic heart disease of native coronary artery without angina pectoris: Secondary | ICD-10-CM | POA: Diagnosis not present

## 2022-02-02 DIAGNOSIS — E785 Hyperlipidemia, unspecified: Secondary | ICD-10-CM | POA: Diagnosis not present

## 2022-02-02 DIAGNOSIS — F17200 Nicotine dependence, unspecified, uncomplicated: Secondary | ICD-10-CM | POA: Diagnosis not present

## 2022-02-02 DIAGNOSIS — I447 Left bundle-branch block, unspecified: Secondary | ICD-10-CM | POA: Diagnosis not present

## 2022-02-02 DIAGNOSIS — M069 Rheumatoid arthritis, unspecified: Secondary | ICD-10-CM | POA: Diagnosis not present

## 2022-02-12 ENCOUNTER — Ambulatory Visit
Admission: RE | Admit: 2022-02-12 | Discharge: 2022-02-12 | Disposition: A | Discharge status: Home / Self Care | Payer: 59 | Source: Ambulatory Visit | Attending: Family Medicine | Admitting: Family Medicine

## 2022-02-12 DIAGNOSIS — R69 Illness, unspecified: Secondary | ICD-10-CM | POA: Diagnosis not present

## 2022-02-12 DIAGNOSIS — F17210 Nicotine dependence, cigarettes, uncomplicated: Secondary | ICD-10-CM

## 2022-02-15 ENCOUNTER — Other Ambulatory Visit: Payer: Self-pay

## 2022-02-15 ENCOUNTER — Telehealth: Payer: Self-pay | Admitting: Acute Care

## 2022-02-15 DIAGNOSIS — Z87891 Personal history of nicotine dependence: Secondary | ICD-10-CM

## 2022-02-15 DIAGNOSIS — H04123 Dry eye syndrome of bilateral lacrimal glands: Secondary | ICD-10-CM | POA: Diagnosis not present

## 2022-02-15 DIAGNOSIS — H532 Diplopia: Secondary | ICD-10-CM | POA: Diagnosis not present

## 2022-02-15 DIAGNOSIS — Z122 Encounter for screening for malignant neoplasm of respiratory organs: Secondary | ICD-10-CM

## 2022-02-15 DIAGNOSIS — F17210 Nicotine dependence, cigarettes, uncomplicated: Secondary | ICD-10-CM

## 2022-02-15 DIAGNOSIS — H5021 Vertical strabismus, right eye: Secondary | ICD-10-CM | POA: Diagnosis not present

## 2022-02-15 DIAGNOSIS — H2513 Age-related nuclear cataract, bilateral: Secondary | ICD-10-CM | POA: Diagnosis not present

## 2022-02-15 NOTE — Telephone Encounter (Signed)
Results reviewed with patient by phone using two identifiers.  LDCT shows atherosclerosis and emphysema, as previously noted.  No suspicious findings for lung cancer.  To continue yearly LDCT - order placed.  Right thyroid nodule noted and mentioned as stable.  Patient unsure as to whether that has been addressed in the past. Not noted on previous scan.  Will route PCP results of scan and notify her to review thyroid nodule comments and follow up with patient if any further recommendations.  Patient acknowledged understanding of results and had no further questions.

## 2022-02-15 NOTE — Telephone Encounter (Signed)
This patient's low-dose CT was read as a lung RADS 2, 68-month annual follow-up will be ordered and scheduled by the lung cancer screening team. Angelique Blonder please call the patient let them know her scan was read as a lung RADS 2, and fax results to Dr. Laurann Montana.  Please order the 48-month annual screening follow-up. I have also called Dr. Lucilla Lame office because I wanted to make sure she was aware of an incidental finding of a 1.8 cm hypodense posterior right thyroid nodule.  It is documented that this nodule is stable however they are recommending a thyroid ultrasound.  I just wanted to make sure Dr. Cliffton Asters was aware.  I spoke with Wille Glaser in her office who will relay this message to her.  Thanks so much

## 2022-02-16 ENCOUNTER — Other Ambulatory Visit: Payer: Self-pay | Admitting: Family Medicine

## 2022-02-16 DIAGNOSIS — E041 Nontoxic single thyroid nodule: Secondary | ICD-10-CM

## 2022-02-16 NOTE — Telephone Encounter (Signed)
See phone note 02/15/22 for notification of patient / order

## 2022-02-26 ENCOUNTER — Other Ambulatory Visit: Payer: 59

## 2022-02-27 ENCOUNTER — Ambulatory Visit
Admission: RE | Admit: 2022-02-27 | Discharge: 2022-02-27 | Disposition: A | Discharge status: Home / Self Care | Payer: 59 | Source: Ambulatory Visit | Attending: Family Medicine | Admitting: Family Medicine

## 2022-02-27 DIAGNOSIS — R69 Illness, unspecified: Secondary | ICD-10-CM | POA: Diagnosis not present

## 2022-02-27 DIAGNOSIS — E041 Nontoxic single thyroid nodule: Secondary | ICD-10-CM

## 2022-03-03 DIAGNOSIS — Z1231 Encounter for screening mammogram for malignant neoplasm of breast: Secondary | ICD-10-CM | POA: Diagnosis not present

## 2022-03-13 DIAGNOSIS — Z96641 Presence of right artificial hip joint: Secondary | ICD-10-CM | POA: Diagnosis not present

## 2022-03-13 DIAGNOSIS — M5441 Lumbago with sciatica, right side: Secondary | ICD-10-CM | POA: Diagnosis not present

## 2022-03-23 DIAGNOSIS — M545 Low back pain, unspecified: Secondary | ICD-10-CM | POA: Diagnosis not present

## 2022-03-29 DIAGNOSIS — M5441 Lumbago with sciatica, right side: Secondary | ICD-10-CM | POA: Diagnosis not present

## 2022-03-29 DIAGNOSIS — M4316 Spondylolisthesis, lumbar region: Secondary | ICD-10-CM | POA: Diagnosis not present

## 2022-04-05 DIAGNOSIS — M5416 Radiculopathy, lumbar region: Secondary | ICD-10-CM | POA: Diagnosis not present

## 2022-04-05 DIAGNOSIS — M4316 Spondylolisthesis, lumbar region: Secondary | ICD-10-CM | POA: Diagnosis not present

## 2022-04-10 DIAGNOSIS — M4316 Spondylolisthesis, lumbar region: Secondary | ICD-10-CM | POA: Diagnosis not present

## 2022-04-10 DIAGNOSIS — M5416 Radiculopathy, lumbar region: Secondary | ICD-10-CM | POA: Diagnosis not present

## 2022-04-12 DIAGNOSIS — M4316 Spondylolisthesis, lumbar region: Secondary | ICD-10-CM | POA: Diagnosis not present

## 2022-04-12 DIAGNOSIS — M5416 Radiculopathy, lumbar region: Secondary | ICD-10-CM | POA: Diagnosis not present

## 2022-04-16 DIAGNOSIS — M5416 Radiculopathy, lumbar region: Secondary | ICD-10-CM | POA: Diagnosis not present

## 2022-04-16 DIAGNOSIS — M4316 Spondylolisthesis, lumbar region: Secondary | ICD-10-CM | POA: Diagnosis not present

## 2022-04-18 DIAGNOSIS — M4316 Spondylolisthesis, lumbar region: Secondary | ICD-10-CM | POA: Diagnosis not present

## 2022-04-18 DIAGNOSIS — M5416 Radiculopathy, lumbar region: Secondary | ICD-10-CM | POA: Diagnosis not present

## 2022-04-23 DIAGNOSIS — M5416 Radiculopathy, lumbar region: Secondary | ICD-10-CM | POA: Diagnosis not present

## 2022-04-23 DIAGNOSIS — M4316 Spondylolisthesis, lumbar region: Secondary | ICD-10-CM | POA: Diagnosis not present

## 2022-05-07 DIAGNOSIS — M4316 Spondylolisthesis, lumbar region: Secondary | ICD-10-CM | POA: Diagnosis not present

## 2022-05-07 DIAGNOSIS — M5416 Radiculopathy, lumbar region: Secondary | ICD-10-CM | POA: Diagnosis not present

## 2022-05-11 DIAGNOSIS — R69 Illness, unspecified: Secondary | ICD-10-CM | POA: Diagnosis not present

## 2022-05-15 DIAGNOSIS — M5416 Radiculopathy, lumbar region: Secondary | ICD-10-CM | POA: Diagnosis not present

## 2022-05-15 DIAGNOSIS — M4316 Spondylolisthesis, lumbar region: Secondary | ICD-10-CM | POA: Diagnosis not present

## 2022-05-17 DIAGNOSIS — M4316 Spondylolisthesis, lumbar region: Secondary | ICD-10-CM | POA: Diagnosis not present

## 2022-05-17 DIAGNOSIS — M5416 Radiculopathy, lumbar region: Secondary | ICD-10-CM | POA: Diagnosis not present

## 2022-05-24 DIAGNOSIS — M4316 Spondylolisthesis, lumbar region: Secondary | ICD-10-CM | POA: Diagnosis not present

## 2022-05-24 DIAGNOSIS — M5416 Radiculopathy, lumbar region: Secondary | ICD-10-CM | POA: Diagnosis not present

## 2022-05-31 DIAGNOSIS — M5416 Radiculopathy, lumbar region: Secondary | ICD-10-CM | POA: Diagnosis not present

## 2022-05-31 DIAGNOSIS — M4316 Spondylolisthesis, lumbar region: Secondary | ICD-10-CM | POA: Diagnosis not present

## 2022-06-04 DIAGNOSIS — M4316 Spondylolisthesis, lumbar region: Secondary | ICD-10-CM | POA: Diagnosis not present

## 2022-06-04 DIAGNOSIS — M5416 Radiculopathy, lumbar region: Secondary | ICD-10-CM | POA: Diagnosis not present

## 2022-06-07 DIAGNOSIS — M5416 Radiculopathy, lumbar region: Secondary | ICD-10-CM | POA: Diagnosis not present

## 2022-06-07 DIAGNOSIS — M4316 Spondylolisthesis, lumbar region: Secondary | ICD-10-CM | POA: Diagnosis not present

## 2022-06-12 DIAGNOSIS — M4316 Spondylolisthesis, lumbar region: Secondary | ICD-10-CM | POA: Diagnosis not present

## 2022-06-12 DIAGNOSIS — M5416 Radiculopathy, lumbar region: Secondary | ICD-10-CM | POA: Diagnosis not present

## 2022-06-15 DIAGNOSIS — R69 Illness, unspecified: Secondary | ICD-10-CM | POA: Diagnosis not present

## 2022-07-13 DIAGNOSIS — R69 Illness, unspecified: Secondary | ICD-10-CM | POA: Diagnosis not present

## 2022-07-30 DIAGNOSIS — M810 Age-related osteoporosis without current pathological fracture: Secondary | ICD-10-CM | POA: Diagnosis not present

## 2022-07-30 DIAGNOSIS — M79643 Pain in unspecified hand: Secondary | ICD-10-CM | POA: Diagnosis not present

## 2022-07-30 DIAGNOSIS — M1990 Unspecified osteoarthritis, unspecified site: Secondary | ICD-10-CM | POA: Diagnosis not present

## 2022-07-30 DIAGNOSIS — Z79899 Other long term (current) drug therapy: Secondary | ICD-10-CM | POA: Diagnosis not present

## 2022-07-30 DIAGNOSIS — M25551 Pain in right hip: Secondary | ICD-10-CM | POA: Diagnosis not present

## 2022-07-30 DIAGNOSIS — Z23 Encounter for immunization: Secondary | ICD-10-CM | POA: Diagnosis not present

## 2022-07-30 DIAGNOSIS — R768 Other specified abnormal immunological findings in serum: Secondary | ICD-10-CM | POA: Diagnosis not present

## 2022-07-30 DIAGNOSIS — M7581 Other shoulder lesions, right shoulder: Secondary | ICD-10-CM | POA: Diagnosis not present

## 2022-07-30 DIAGNOSIS — K219 Gastro-esophageal reflux disease without esophagitis: Secondary | ICD-10-CM | POA: Diagnosis not present

## 2022-07-30 DIAGNOSIS — M0579 Rheumatoid arthritis with rheumatoid factor of multiple sites without organ or systems involvement: Secondary | ICD-10-CM | POA: Diagnosis not present

## 2022-08-07 DIAGNOSIS — R69 Illness, unspecified: Secondary | ICD-10-CM | POA: Diagnosis not present

## 2022-08-07 DIAGNOSIS — J439 Emphysema, unspecified: Secondary | ICD-10-CM | POA: Diagnosis not present

## 2022-08-07 DIAGNOSIS — E785 Hyperlipidemia, unspecified: Secondary | ICD-10-CM | POA: Diagnosis not present

## 2022-08-07 DIAGNOSIS — I700 Atherosclerosis of aorta: Secondary | ICD-10-CM | POA: Diagnosis not present

## 2022-08-07 DIAGNOSIS — I2510 Atherosclerotic heart disease of native coronary artery without angina pectoris: Secondary | ICD-10-CM | POA: Diagnosis not present

## 2022-08-07 DIAGNOSIS — F419 Anxiety disorder, unspecified: Secondary | ICD-10-CM | POA: Diagnosis not present

## 2022-10-09 DIAGNOSIS — F3112 Bipolar disorder, current episode manic without psychotic features, moderate: Secondary | ICD-10-CM | POA: Diagnosis not present

## 2022-10-30 DIAGNOSIS — M79643 Pain in unspecified hand: Secondary | ICD-10-CM | POA: Diagnosis not present

## 2022-10-30 DIAGNOSIS — Z79899 Other long term (current) drug therapy: Secondary | ICD-10-CM | POA: Diagnosis not present

## 2022-10-30 DIAGNOSIS — M1990 Unspecified osteoarthritis, unspecified site: Secondary | ICD-10-CM | POA: Diagnosis not present

## 2022-10-30 DIAGNOSIS — M0579 Rheumatoid arthritis with rheumatoid factor of multiple sites without organ or systems involvement: Secondary | ICD-10-CM | POA: Diagnosis not present

## 2022-10-30 DIAGNOSIS — M810 Age-related osteoporosis without current pathological fracture: Secondary | ICD-10-CM | POA: Diagnosis not present

## 2022-10-30 DIAGNOSIS — R768 Other specified abnormal immunological findings in serum: Secondary | ICD-10-CM | POA: Diagnosis not present

## 2022-10-30 DIAGNOSIS — K219 Gastro-esophageal reflux disease without esophagitis: Secondary | ICD-10-CM | POA: Diagnosis not present

## 2022-11-12 DIAGNOSIS — F3112 Bipolar disorder, current episode manic without psychotic features, moderate: Secondary | ICD-10-CM | POA: Diagnosis not present

## 2022-11-29 DIAGNOSIS — F17200 Nicotine dependence, unspecified, uncomplicated: Secondary | ICD-10-CM | POA: Diagnosis not present

## 2022-11-29 DIAGNOSIS — R6884 Jaw pain: Secondary | ICD-10-CM | POA: Diagnosis not present

## 2022-11-29 DIAGNOSIS — J019 Acute sinusitis, unspecified: Secondary | ICD-10-CM | POA: Diagnosis not present

## 2022-11-29 DIAGNOSIS — B9689 Other specified bacterial agents as the cause of diseases classified elsewhere: Secondary | ICD-10-CM | POA: Diagnosis not present

## 2023-01-21 ENCOUNTER — Other Ambulatory Visit: Payer: Self-pay | Admitting: Acute Care

## 2023-01-21 DIAGNOSIS — Z87891 Personal history of nicotine dependence: Secondary | ICD-10-CM

## 2023-01-21 DIAGNOSIS — Z122 Encounter for screening for malignant neoplasm of respiratory organs: Secondary | ICD-10-CM

## 2023-01-21 DIAGNOSIS — F17210 Nicotine dependence, cigarettes, uncomplicated: Secondary | ICD-10-CM

## 2023-02-06 ENCOUNTER — Other Ambulatory Visit: Payer: Self-pay | Admitting: Family Medicine

## 2023-02-06 DIAGNOSIS — E041 Nontoxic single thyroid nodule: Secondary | ICD-10-CM

## 2023-02-08 DIAGNOSIS — F3112 Bipolar disorder, current episode manic without psychotic features, moderate: Secondary | ICD-10-CM | POA: Diagnosis not present

## 2023-02-11 DIAGNOSIS — M810 Age-related osteoporosis without current pathological fracture: Secondary | ICD-10-CM | POA: Diagnosis not present

## 2023-02-11 DIAGNOSIS — E785 Hyperlipidemia, unspecified: Secondary | ICD-10-CM | POA: Diagnosis not present

## 2023-02-11 DIAGNOSIS — F411 Generalized anxiety disorder: Secondary | ICD-10-CM | POA: Diagnosis not present

## 2023-02-11 DIAGNOSIS — E559 Vitamin D deficiency, unspecified: Secondary | ICD-10-CM | POA: Diagnosis not present

## 2023-02-11 DIAGNOSIS — F330 Major depressive disorder, recurrent, mild: Secondary | ICD-10-CM | POA: Diagnosis not present

## 2023-02-11 DIAGNOSIS — Z8742 Personal history of other diseases of the female genital tract: Secondary | ICD-10-CM | POA: Diagnosis not present

## 2023-02-11 DIAGNOSIS — R202 Paresthesia of skin: Secondary | ICD-10-CM | POA: Diagnosis not present

## 2023-02-11 DIAGNOSIS — I2510 Atherosclerotic heart disease of native coronary artery without angina pectoris: Secondary | ICD-10-CM | POA: Diagnosis not present

## 2023-02-11 DIAGNOSIS — K219 Gastro-esophageal reflux disease without esophagitis: Secondary | ICD-10-CM | POA: Diagnosis not present

## 2023-02-11 DIAGNOSIS — I700 Atherosclerosis of aorta: Secondary | ICD-10-CM | POA: Diagnosis not present

## 2023-02-11 DIAGNOSIS — R7303 Prediabetes: Secondary | ICD-10-CM | POA: Diagnosis not present

## 2023-02-11 DIAGNOSIS — F17210 Nicotine dependence, cigarettes, uncomplicated: Secondary | ICD-10-CM | POA: Diagnosis not present

## 2023-02-11 DIAGNOSIS — R87610 Atypical squamous cells of undetermined significance on cytologic smear of cervix (ASC-US): Secondary | ICD-10-CM | POA: Diagnosis not present

## 2023-02-11 DIAGNOSIS — Z0000 Encounter for general adult medical examination without abnormal findings: Secondary | ICD-10-CM | POA: Diagnosis not present

## 2023-02-18 IMAGING — CT CT CHEST LUNG CANCER SCREENING LOW DOSE W/O CM
1 of 2 series · 10 of 20 positions shown, 13 images · non-contrast
Comparison: Low-dose lung cancer screening chest CT 10/30/2019.

CLINICAL DATA: 64-year-old current smoker with female 36 pack-year
history of smoking. Lung cancer screening examination.

EXAM:
CT CHEST WITHOUT CONTRAST LOW-DOSE FOR LUNG CANCER SCREENING
TECHNIQUE: Multidetector CT imaging of the chest was performed following the
standard protocol without IV contrast.

[ct lung segmentation data · axial · 0.72mm/px · z∈[-378,-378]mm · 10 of 362 frames shown]
[frame 1/362  mediastinal]
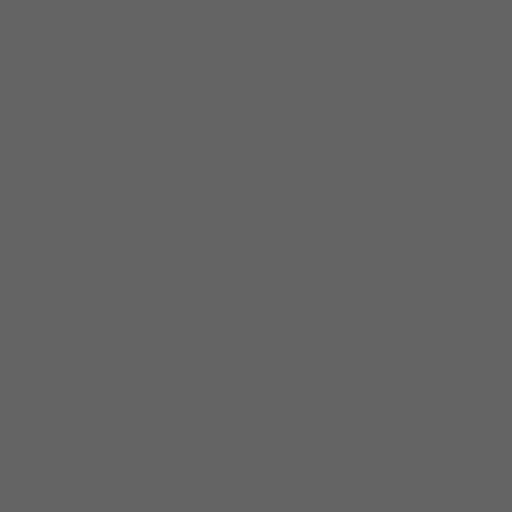
[frame 1/362  lung]
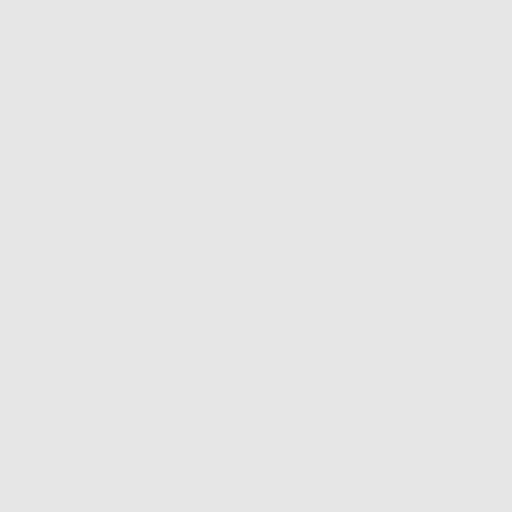
[frame 41/362  lung]
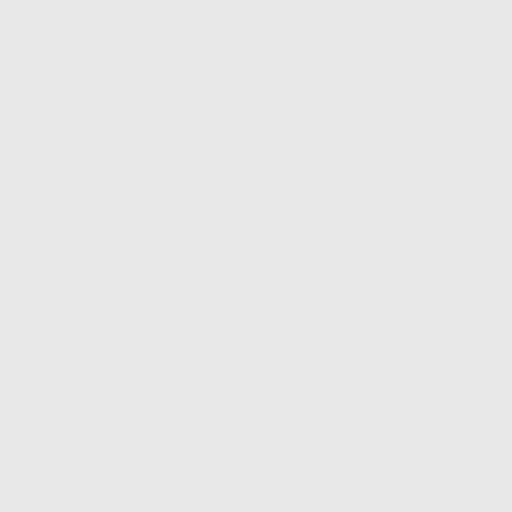
[frame 81/362  lung]
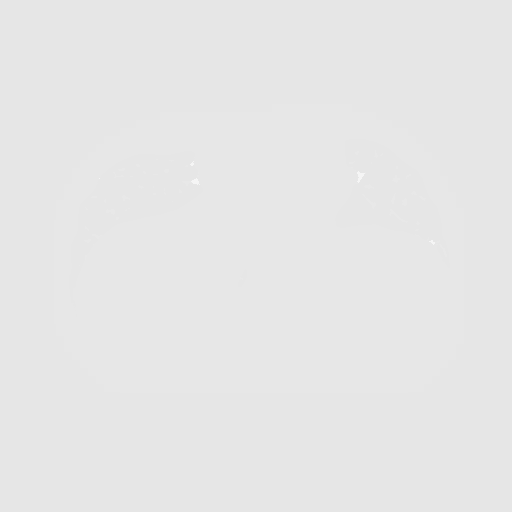
[frame 121/362  lung]
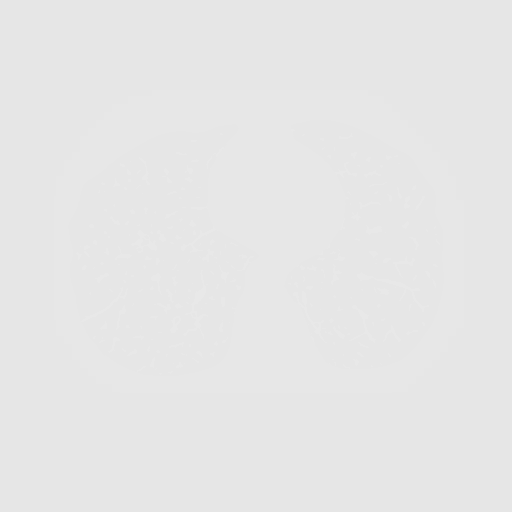
[frame 161/362  mediastinal]
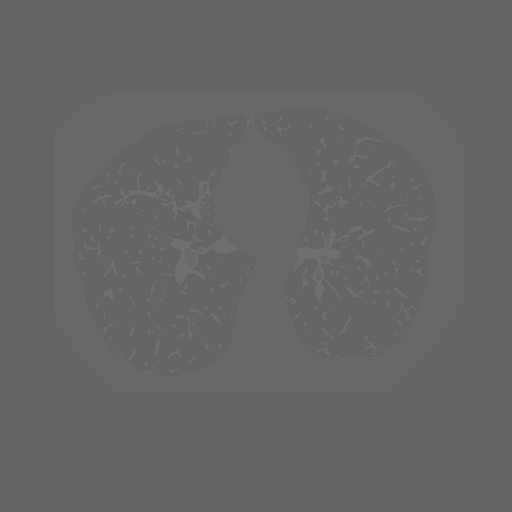
[frame 161/362  lung]
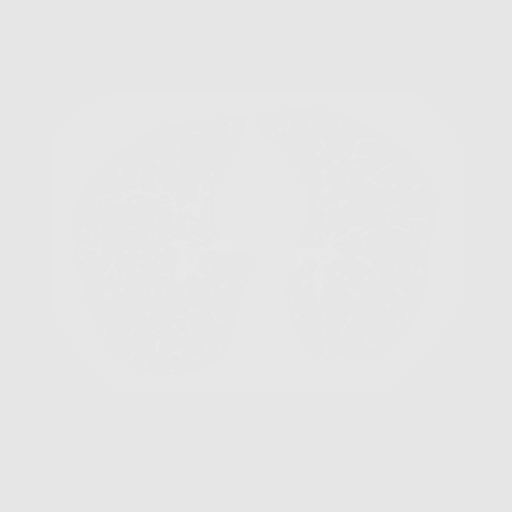
[frame 201/362  lung]
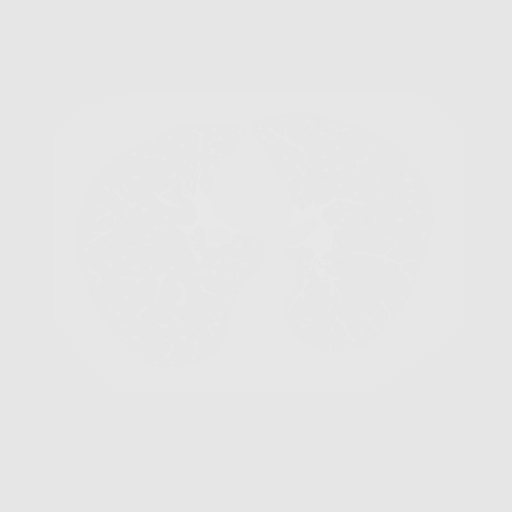
[frame 241/362  lung]
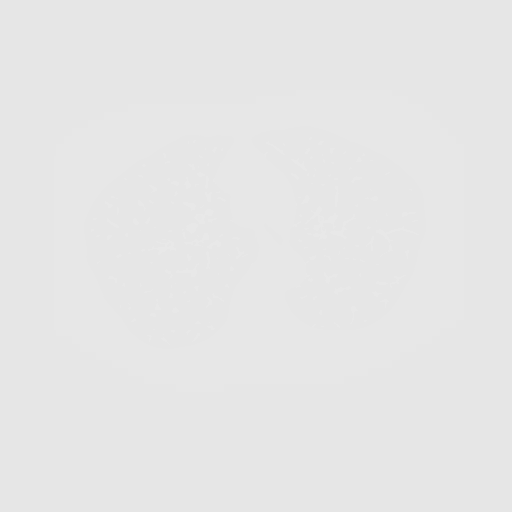
[frame 281/362  lung]
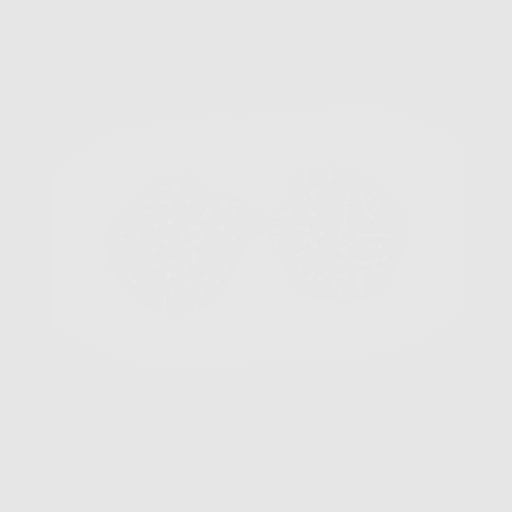
[frame 321/362  mediastinal]
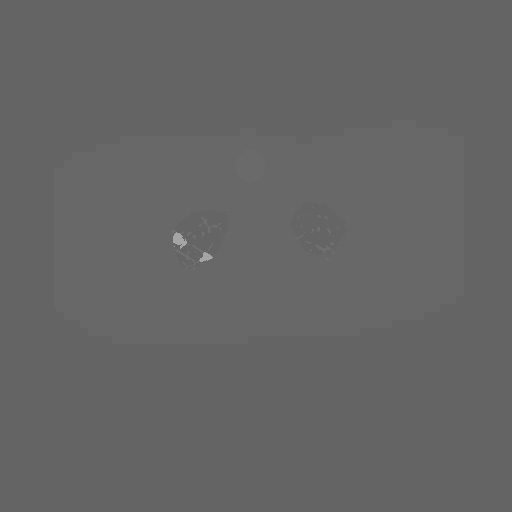
[frame 321/362  lung]
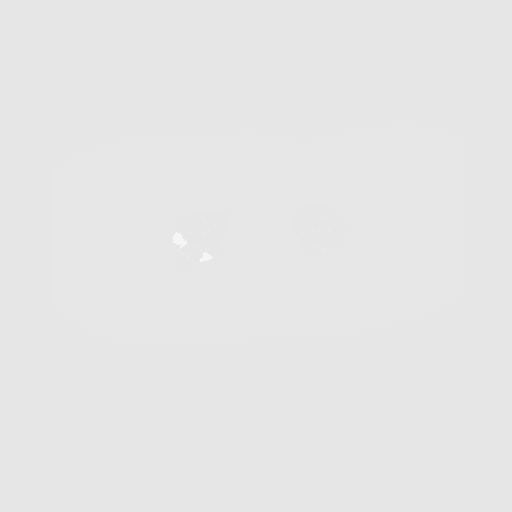
[frame 362/362  lung]
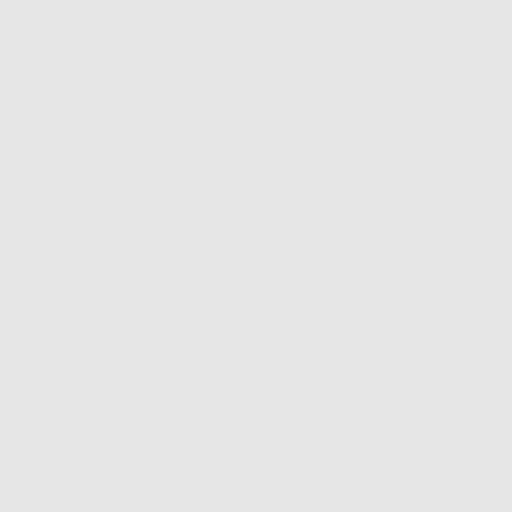

[10 of 20 positions shown; findings below may reference images not displayed]

FINDINGS: Cardiovascular: Heart size is normal. There is no significant
pericardial fluid, thickening or pericardial calcification. There is
aortic atherosclerosis, as well as atherosclerosis of the great
vessels of the mediastinum and the coronary arteries, including
calcified atherosclerotic plaque in the left main, left anterior
descending, left circumflex and right coronary arteries. Mild
calcifications of the aortic valve.

Mediastinum/Nodes: No pathologically enlarged mediastinal or hilar
lymph nodes. Please note that accurate exclusion of hilar adenopathy
is limited on noncontrast CT scans. Esophagus is unremarkable in
appearance. No axillary lymphadenopathy.

Lungs/Pleura: Multiple small pulmonary nodules are again noted in
the lungs bilaterally, largest of which is in the right lower lobe
(axial image 202 of series 3), with a volume derived mean diameter
of 4.0 mm. No other larger more suspicious appearing pulmonary
nodules or masses are noted. No acute consolidative airspace
disease. No pleural effusions. Diffuse bronchial wall thickening
with very mild centrilobular and paraseptal emphysema.

Upper Abdomen: Aortic atherosclerosis.

Musculoskeletal: There are no aggressive appearing lytic or blastic
lesions noted in the visualized portions of the skeleton.
IMPRESSION: 1. Lung-RADS 2S, benign appearance or behavior. Continue annual
screening with low-dose chest CT without contrast in 12 months.
2. The "S" modifier above refers to potentially clinically
significant non lung cancer related findings. Specifically, there is
aortic atherosclerosis, in addition to left main and 3 vessel
coronary artery disease. Please note that although the presence of
coronary artery calcium documents the presence of coronary artery
disease, the severity of this disease and any potential stenosis
cannot be assessed on this non-gated CT examination. Assessment for
potential risk factor modification, dietary therapy or pharmacologic
therapy may be warranted, if clinically indicated.
3. Mild diffuse bronchial wall thickening with very mild
centrilobular and paraseptal emphysema; imaging findings suggestive
of underlying COPD.

Aortic Atherosclerosis (0KJHA-YX9.9) and Emphysema (0KJHA-ZNF.P).

## 2023-02-19 ENCOUNTER — Ambulatory Visit
Admission: RE | Admit: 2023-02-19 | Discharge: 2023-02-19 | Disposition: A | Discharge status: Home / Self Care | Payer: 59 | Source: Ambulatory Visit | Attending: Acute Care | Admitting: Acute Care

## 2023-02-19 DIAGNOSIS — Z87891 Personal history of nicotine dependence: Secondary | ICD-10-CM | POA: Diagnosis not present

## 2023-02-19 DIAGNOSIS — Z122 Encounter for screening for malignant neoplasm of respiratory organs: Secondary | ICD-10-CM

## 2023-02-19 DIAGNOSIS — F17210 Nicotine dependence, cigarettes, uncomplicated: Secondary | ICD-10-CM

## 2023-02-25 ENCOUNTER — Other Ambulatory Visit: Payer: Self-pay | Admitting: Acute Care

## 2023-02-25 DIAGNOSIS — Z87891 Personal history of nicotine dependence: Secondary | ICD-10-CM

## 2023-02-25 DIAGNOSIS — F17210 Nicotine dependence, cigarettes, uncomplicated: Secondary | ICD-10-CM

## 2023-02-25 DIAGNOSIS — Z122 Encounter for screening for malignant neoplasm of respiratory organs: Secondary | ICD-10-CM

## 2023-03-08 DIAGNOSIS — F341 Dysthymic disorder: Secondary | ICD-10-CM | POA: Diagnosis not present

## 2023-03-11 ENCOUNTER — Ambulatory Visit
Admission: RE | Admit: 2023-03-11 | Discharge: 2023-03-11 | Disposition: A | Discharge status: Home / Self Care | Payer: 59 | Source: Ambulatory Visit | Attending: Family Medicine | Admitting: Family Medicine

## 2023-03-11 DIAGNOSIS — E041 Nontoxic single thyroid nodule: Secondary | ICD-10-CM | POA: Diagnosis not present

## 2023-03-22 DIAGNOSIS — M069 Rheumatoid arthritis, unspecified: Secondary | ICD-10-CM | POA: Diagnosis not present

## 2023-03-22 DIAGNOSIS — M810 Age-related osteoporosis without current pathological fracture: Secondary | ICD-10-CM | POA: Diagnosis not present

## 2023-03-22 DIAGNOSIS — Z1231 Encounter for screening mammogram for malignant neoplasm of breast: Secondary | ICD-10-CM | POA: Diagnosis not present

## 2023-04-12 DIAGNOSIS — F341 Dysthymic disorder: Secondary | ICD-10-CM | POA: Diagnosis not present

## 2023-05-10 DIAGNOSIS — F341 Dysthymic disorder: Secondary | ICD-10-CM | POA: Diagnosis not present

## 2023-05-15 DIAGNOSIS — M810 Age-related osteoporosis without current pathological fracture: Secondary | ICD-10-CM | POA: Diagnosis not present

## 2023-05-15 DIAGNOSIS — R768 Other specified abnormal immunological findings in serum: Secondary | ICD-10-CM | POA: Diagnosis not present

## 2023-05-15 DIAGNOSIS — Z79899 Other long term (current) drug therapy: Secondary | ICD-10-CM | POA: Diagnosis not present

## 2023-05-15 DIAGNOSIS — M0579 Rheumatoid arthritis with rheumatoid factor of multiple sites without organ or systems involvement: Secondary | ICD-10-CM | POA: Diagnosis not present

## 2023-05-15 DIAGNOSIS — K219 Gastro-esophageal reflux disease without esophagitis: Secondary | ICD-10-CM | POA: Diagnosis not present

## 2023-05-15 DIAGNOSIS — M1990 Unspecified osteoarthritis, unspecified site: Secondary | ICD-10-CM | POA: Diagnosis not present

## 2023-05-15 DIAGNOSIS — M79643 Pain in unspecified hand: Secondary | ICD-10-CM | POA: Diagnosis not present

## 2023-06-03 DIAGNOSIS — M810 Age-related osteoporosis without current pathological fracture: Secondary | ICD-10-CM | POA: Diagnosis not present

## 2023-06-14 DIAGNOSIS — F341 Dysthymic disorder: Secondary | ICD-10-CM | POA: Diagnosis not present

## 2023-06-27 DIAGNOSIS — Z1211 Encounter for screening for malignant neoplasm of colon: Secondary | ICD-10-CM | POA: Diagnosis not present

## 2023-06-27 DIAGNOSIS — Z01818 Encounter for other preprocedural examination: Secondary | ICD-10-CM | POA: Diagnosis not present

## 2023-06-27 DIAGNOSIS — K219 Gastro-esophageal reflux disease without esophagitis: Secondary | ICD-10-CM | POA: Diagnosis not present

## 2023-08-05 DIAGNOSIS — Z1211 Encounter for screening for malignant neoplasm of colon: Secondary | ICD-10-CM | POA: Diagnosis not present

## 2023-08-05 DIAGNOSIS — K635 Polyp of colon: Secondary | ICD-10-CM | POA: Diagnosis not present

## 2023-08-05 DIAGNOSIS — K648 Other hemorrhoids: Secondary | ICD-10-CM | POA: Diagnosis not present

## 2023-08-05 DIAGNOSIS — D128 Benign neoplasm of rectum: Secondary | ICD-10-CM | POA: Diagnosis not present

## 2023-09-06 DIAGNOSIS — F341 Dysthymic disorder: Secondary | ICD-10-CM | POA: Diagnosis not present

## 2023-10-11 DIAGNOSIS — F341 Dysthymic disorder: Secondary | ICD-10-CM | POA: Diagnosis not present

## 2023-11-29 DIAGNOSIS — F341 Dysthymic disorder: Secondary | ICD-10-CM | POA: Diagnosis not present

## 2024-02-20 ENCOUNTER — Ambulatory Visit
Admission: RE | Admit: 2024-02-20 | Discharge: 2024-02-20 | Disposition: A | Discharge status: Home / Self Care | Source: Ambulatory Visit | Attending: Acute Care | Admitting: Acute Care

## 2024-02-20 DIAGNOSIS — F17210 Nicotine dependence, cigarettes, uncomplicated: Secondary | ICD-10-CM

## 2024-02-20 DIAGNOSIS — Z87891 Personal history of nicotine dependence: Secondary | ICD-10-CM

## 2024-02-20 DIAGNOSIS — Z122 Encounter for screening for malignant neoplasm of respiratory organs: Secondary | ICD-10-CM | POA: Diagnosis not present

## 2024-02-21 DIAGNOSIS — F341 Dysthymic disorder: Secondary | ICD-10-CM | POA: Diagnosis not present

## 2024-03-11 ENCOUNTER — Other Ambulatory Visit: Payer: Self-pay | Admitting: Acute Care

## 2024-03-11 DIAGNOSIS — F17210 Nicotine dependence, cigarettes, uncomplicated: Secondary | ICD-10-CM

## 2024-03-11 DIAGNOSIS — Z122 Encounter for screening for malignant neoplasm of respiratory organs: Secondary | ICD-10-CM

## 2024-03-11 DIAGNOSIS — Z87891 Personal history of nicotine dependence: Secondary | ICD-10-CM

## 2024-03-13 ENCOUNTER — Encounter: Payer: Self-pay | Admitting: Family Medicine

## 2024-03-13 ENCOUNTER — Other Ambulatory Visit: Payer: Self-pay | Admitting: Family Medicine

## 2024-03-13 DIAGNOSIS — E041 Nontoxic single thyroid nodule: Secondary | ICD-10-CM

## 2024-03-17 ENCOUNTER — Ambulatory Visit
Admission: RE | Admit: 2024-03-17 | Discharge: 2024-03-17 | Discharge status: Home / Self Care | Source: Ambulatory Visit | Attending: Family Medicine | Admitting: Family Medicine

## 2024-03-17 DIAGNOSIS — E041 Nontoxic single thyroid nodule: Secondary | ICD-10-CM | POA: Diagnosis not present

## 2024-05-07 DIAGNOSIS — E785 Hyperlipidemia, unspecified: Secondary | ICD-10-CM | POA: Diagnosis not present

## 2024-05-07 DIAGNOSIS — E041 Nontoxic single thyroid nodule: Secondary | ICD-10-CM | POA: Diagnosis not present

## 2024-05-07 DIAGNOSIS — R7303 Prediabetes: Secondary | ICD-10-CM | POA: Diagnosis not present

## 2024-05-07 DIAGNOSIS — E559 Vitamin D deficiency, unspecified: Secondary | ICD-10-CM | POA: Diagnosis not present

## 2024-05-07 DIAGNOSIS — M810 Age-related osteoporosis without current pathological fracture: Secondary | ICD-10-CM | POA: Diagnosis not present

## 2024-05-14 DIAGNOSIS — I700 Atherosclerosis of aorta: Secondary | ICD-10-CM | POA: Diagnosis not present

## 2024-05-14 DIAGNOSIS — R7303 Prediabetes: Secondary | ICD-10-CM | POA: Diagnosis not present

## 2024-05-14 DIAGNOSIS — M069 Rheumatoid arthritis, unspecified: Secondary | ICD-10-CM | POA: Diagnosis not present

## 2024-05-14 DIAGNOSIS — Z0000 Encounter for general adult medical examination without abnormal findings: Secondary | ICD-10-CM | POA: Diagnosis not present

## 2024-05-14 DIAGNOSIS — D849 Immunodeficiency, unspecified: Secondary | ICD-10-CM | POA: Diagnosis not present

## 2024-05-14 DIAGNOSIS — E785 Hyperlipidemia, unspecified: Secondary | ICD-10-CM | POA: Diagnosis not present

## 2024-05-14 DIAGNOSIS — J439 Emphysema, unspecified: Secondary | ICD-10-CM | POA: Diagnosis not present

## 2024-05-14 DIAGNOSIS — Z23 Encounter for immunization: Secondary | ICD-10-CM | POA: Diagnosis not present

## 2024-05-14 DIAGNOSIS — I2510 Atherosclerotic heart disease of native coronary artery without angina pectoris: Secondary | ICD-10-CM | POA: Diagnosis not present

## 2024-05-14 DIAGNOSIS — F330 Major depressive disorder, recurrent, mild: Secondary | ICD-10-CM | POA: Diagnosis not present

## 2024-05-14 DIAGNOSIS — F419 Anxiety disorder, unspecified: Secondary | ICD-10-CM | POA: Diagnosis not present

## 2024-05-14 DIAGNOSIS — M810 Age-related osteoporosis without current pathological fracture: Secondary | ICD-10-CM | POA: Diagnosis not present

## 2024-05-15 DIAGNOSIS — F341 Dysthymic disorder: Secondary | ICD-10-CM | POA: Diagnosis not present

## 2024-06-15 DIAGNOSIS — M810 Age-related osteoporosis without current pathological fracture: Secondary | ICD-10-CM | POA: Diagnosis not present

## 2024-06-18 DIAGNOSIS — M79643 Pain in unspecified hand: Secondary | ICD-10-CM | POA: Diagnosis not present

## 2024-06-18 DIAGNOSIS — K219 Gastro-esophageal reflux disease without esophagitis: Secondary | ICD-10-CM | POA: Diagnosis not present

## 2024-06-18 DIAGNOSIS — M79671 Pain in right foot: Secondary | ICD-10-CM | POA: Diagnosis not present

## 2024-06-18 DIAGNOSIS — M810 Age-related osteoporosis without current pathological fracture: Secondary | ICD-10-CM | POA: Diagnosis not present

## 2024-06-18 DIAGNOSIS — M79672 Pain in left foot: Secondary | ICD-10-CM | POA: Diagnosis not present

## 2024-06-18 DIAGNOSIS — R768 Other specified abnormal immunological findings in serum: Secondary | ICD-10-CM | POA: Diagnosis not present

## 2024-06-18 DIAGNOSIS — Z79899 Other long term (current) drug therapy: Secondary | ICD-10-CM | POA: Diagnosis not present

## 2024-06-18 DIAGNOSIS — M79642 Pain in left hand: Secondary | ICD-10-CM | POA: Diagnosis not present

## 2024-06-18 DIAGNOSIS — M1990 Unspecified osteoarthritis, unspecified site: Secondary | ICD-10-CM | POA: Diagnosis not present

## 2024-06-18 DIAGNOSIS — M79641 Pain in right hand: Secondary | ICD-10-CM | POA: Diagnosis not present

## 2024-06-18 DIAGNOSIS — M0579 Rheumatoid arthritis with rheumatoid factor of multiple sites without organ or systems involvement: Secondary | ICD-10-CM | POA: Diagnosis not present

## 2024-06-18 DIAGNOSIS — Z23 Encounter for immunization: Secondary | ICD-10-CM | POA: Diagnosis not present

## 2024-06-19 DIAGNOSIS — F341 Dysthymic disorder: Secondary | ICD-10-CM | POA: Diagnosis not present

## 2024-06-24 DIAGNOSIS — F17210 Nicotine dependence, cigarettes, uncomplicated: Secondary | ICD-10-CM | POA: Diagnosis not present

## 2024-06-24 DIAGNOSIS — I2510 Atherosclerotic heart disease of native coronary artery without angina pectoris: Secondary | ICD-10-CM | POA: Diagnosis not present

## 2024-06-24 DIAGNOSIS — F330 Major depressive disorder, recurrent, mild: Secondary | ICD-10-CM | POA: Diagnosis not present

## 2024-06-24 DIAGNOSIS — Z79899 Other long term (current) drug therapy: Secondary | ICD-10-CM | POA: Diagnosis not present

## 2024-06-24 DIAGNOSIS — E785 Hyperlipidemia, unspecified: Secondary | ICD-10-CM | POA: Diagnosis not present

## 2024-06-24 DIAGNOSIS — F419 Anxiety disorder, unspecified: Secondary | ICD-10-CM | POA: Diagnosis not present

## 2024-07-14 DIAGNOSIS — F341 Dysthymic disorder: Secondary | ICD-10-CM | POA: Diagnosis not present

## 2024-10-14 ENCOUNTER — Ambulatory Visit: Admitting: Diagnostic Neuroimaging

## 2025-02-22 ENCOUNTER — Ambulatory Visit: Admitting: Diagnostic Neuroimaging
